# Patient Record
Sex: Female | Born: 1975 | Race: White | Hispanic: No | Marital: Married | Smoking: Never smoker
Health system: Southern US, Community
[De-identification: ages and names within clinical notes are randomized; demographics above are authoritative.]

## PROBLEM LIST (undated history)

## (undated) DIAGNOSIS — N3010 Interstitial cystitis (chronic) without hematuria: Secondary | ICD-10-CM

## (undated) DIAGNOSIS — K589 Irritable bowel syndrome without diarrhea: Secondary | ICD-10-CM

## (undated) DIAGNOSIS — E282 Polycystic ovarian syndrome: Secondary | ICD-10-CM

## (undated) DIAGNOSIS — T7840XA Allergy, unspecified, initial encounter: Secondary | ICD-10-CM

## (undated) DIAGNOSIS — E559 Vitamin D deficiency, unspecified: Secondary | ICD-10-CM

## (undated) DIAGNOSIS — G4700 Insomnia, unspecified: Secondary | ICD-10-CM

## (undated) DIAGNOSIS — R519 Headache, unspecified: Secondary | ICD-10-CM

## (undated) DIAGNOSIS — F319 Bipolar disorder, unspecified: Secondary | ICD-10-CM

## (undated) DIAGNOSIS — T4145XA Adverse effect of unspecified anesthetic, initial encounter: Secondary | ICD-10-CM

## (undated) DIAGNOSIS — F329 Major depressive disorder, single episode, unspecified: Secondary | ICD-10-CM

## (undated) DIAGNOSIS — R51 Headache: Secondary | ICD-10-CM

## (undated) DIAGNOSIS — J189 Pneumonia, unspecified organism: Secondary | ICD-10-CM

## (undated) DIAGNOSIS — F32A Depression, unspecified: Secondary | ICD-10-CM

## (undated) DIAGNOSIS — T8859XA Other complications of anesthesia, initial encounter: Secondary | ICD-10-CM

## (undated) DIAGNOSIS — K219 Gastro-esophageal reflux disease without esophagitis: Secondary | ICD-10-CM

## (undated) DIAGNOSIS — J45909 Unspecified asthma, uncomplicated: Secondary | ICD-10-CM

## (undated) HISTORY — DX: Irritable bowel syndrome, unspecified: K58.9

## (undated) HISTORY — DX: Major depressive disorder, single episode, unspecified: F32.9

## (undated) HISTORY — DX: Unspecified asthma, uncomplicated: J45.909

## (undated) HISTORY — DX: Allergy, unspecified, initial encounter: T78.40XA

## (undated) HISTORY — DX: Depression, unspecified: F32.A

## (undated) HISTORY — DX: Vitamin D deficiency, unspecified: E55.9

## (undated) HISTORY — PX: SALPINGECTOMY: SHX328

## (undated) HISTORY — PX: OTHER SURGICAL HISTORY: SHX169

## (undated) HISTORY — DX: Polycystic ovarian syndrome: E28.2

## (undated) HISTORY — PX: NASAL TURBINATE REDUCTION: SHX2072

## (undated) HISTORY — PX: EYE SURGERY: SHX253

---
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
## 2010-08-20 ENCOUNTER — Emergency Department (HOSPITAL_COMMUNITY): Admission: EM | Admit: 2010-08-20 | Discharge: 2010-08-20 | Payer: Self-pay | Admitting: Emergency Medicine

## 2012-12-30 ENCOUNTER — Ambulatory Visit (INDEPENDENT_AMBULATORY_CARE_PROVIDER_SITE_OTHER): Payer: BC Managed Care – PPO | Admitting: Physician Assistant

## 2012-12-30 VITALS — BP 125/85 | HR 95 | Temp 97.8°F | Resp 18 | Ht 64.25 in | Wt 195.2 lb

## 2012-12-30 DIAGNOSIS — J45909 Unspecified asthma, uncomplicated: Secondary | ICD-10-CM

## 2012-12-30 DIAGNOSIS — J329 Chronic sinusitis, unspecified: Secondary | ICD-10-CM

## 2012-12-30 MED ORDER — ALBUTEROL SULFATE HFA 108 (90 BASE) MCG/ACT IN AERS: 2.0000 | INHALATION_SPRAY | Freq: Four times a day (QID) | RESPIRATORY_TRACT | Status: DC | PRN

## 2012-12-30 MED ORDER — AMOXICILLIN-POT CLAVULANATE 875-125 MG PO TABS: 1.0000 | ORAL_TABLET | Freq: Two times a day (BID) | ORAL | Status: DC

## 2012-12-30 MED ORDER — IPRATROPIUM BROMIDE 0.03 % NA SOLN: 2.0000 | Freq: Two times a day (BID) | NASAL | Status: DC

## 2012-12-30 MED ORDER — PREDNISONE 20 MG PO TABS: ORAL_TABLET | ORAL | Status: DC

## 2012-12-30 NOTE — Progress Notes (Signed)
  Subjective:    Patient ID: Kristi Davies, female    DOB: Jul 18, 1976, 37 y.o.   MRN: [id number]  HPI   Kristi Davies is a 37 yr old female here with concern for sinusitis.  States she had a cold at the end of November but got over it.  Then towards the end of December/beginning of January, began having URI symptoms again.  Feels like she has fluid in her ears.  Hearing somewhat decreased, R>L.  Sinus pressure, facial pain, tooth pain.  Nasal drainage is clear/yellow.  Endorses lots of PND.  Additionally she has a non-productive cough.  Had an asthma attack last night that calmed down with albuterol.  Has a history of asthma, with very few symptoms, usually exercise induced.  Has used Benadryl and Sudafed occasionally, but does not like the way Sudafed makes her feel.  Symptoms have been present for nearly 3 weeks.    Review of Systems  Constitutional: Negative for fever and chills.  HENT: Positive for ear pain, congestion, rhinorrhea and sinus pressure. Negative for sore throat.   Respiratory: Positive for cough, shortness of breath (with asthma attack) and wheezing (with asthma attack).   Cardiovascular: Negative.   Gastrointestinal: Negative.   Musculoskeletal: Negative.   Skin: Negative.   Neurological: Positive for headaches.       Objective:   Physical Exam  Vitals reviewed. Constitutional: She is oriented to person, place, and time. She appears well-developed and well-nourished. No distress.  HENT:  Head: Normocephalic and atraumatic.  Right Ear: Tympanic membrane and ear canal normal.  Left Ear: Ear canal normal. A middle ear effusion is present.  Nose: Right sinus exhibits maxillary sinus tenderness. Right sinus exhibits no frontal sinus tenderness. Left sinus exhibits maxillary sinus tenderness. Left sinus exhibits no frontal sinus tenderness.  Mouth/Throat: Uvula is midline, oropharynx is clear and moist and mucous membranes are normal.  Eyes: Conjunctivae normal are normal.  No scleral icterus.  Neck: Neck supple.  Cardiovascular: Normal rate, regular rhythm, normal heart sounds and intact distal pulses.  Exam reveals no gallop and no friction rub.   No murmur heard. Pulmonary/Chest: Effort normal and breath sounds normal. She has no decreased breath sounds (cough with deep insp). She has no wheezes. She has no rales.  Lymphadenopathy:    She has cervical adenopathy (left).  Neurological: She is alert and oriented to person, place, and time.  Skin: Skin is warm and dry.  Psychiatric: She has a normal mood and affect. Her behavior is normal.      Filed Vitals:   12/30/12 0913  BP: 125/85  Pulse: 95  Temp: 97.8 F (36.6 C)  Resp: 18       Assessment & Plan:   1. Sinusitis  ipratropium (ATROVENT) 0.03 % nasal spray, amoxicillin-clavulanate (AUGMENTIN) 875-125 MG per tablet  2. Asthma  predniSONE (DELTASONE) 20 MG tablet, albuterol (PROVENTIL HFA;VENTOLIN HFA) 108 (90 BASE) MCG/ACT inhaler    Kristi Davies is a very pleasant 37 yr old female with sinusitis.  Will treat with amox/clav and atrovent.  Additionally will try prednisone taper for asthma/cough.  Encouraged scheduled use of albuterol for the next 1-2 days, then prn.  Encouraged fluids and rest.  OTC antihistamines may offer some relief as well.  Discussed RTC precautions.  Pt understands and is in agreement with this plan.

## 2012-12-30 NOTE — Patient Instructions (Addendum)
Begin taking the antibiotic as directed.  Be sure to finish the full course.  Take with food to reduce stomach upset.  The Atrovent nasal spray can be used twice daily for relief of congestion and post-nasal drainage.  This should also help with the pressure in your ears.  An over the counter allergy medicine may also be helpful, like Claritin/Allegra/Zyrtec.  Start the prednisone taper as directed.  I am hopeful that this will calm down the cough.  Use albuterol inhaler scheduled every 4-6 hours for the next day or so, then as needed.  Plenty of fluids and rest.  Let us know if things are worsening or not improving.   Sinusitis Sinusitis is redness, soreness, and swelling (inflammation) of the paranasal sinuses. Paranasal sinuses are air pockets within the bones of your face (beneath the eyes, the middle of the forehead, or above the eyes). In healthy paranasal sinuses, mucus is able to drain out, and air is able to circulate through them by way of your nose. However, when your paranasal sinuses are inflamed, mucus and air can become trapped. This can allow bacteria and other germs to grow and cause infection. Sinusitis can develop quickly and last only a short time (acute) or continue over a long period (chronic). Sinusitis that lasts for more than 12 weeks is considered chronic.  CAUSES  Causes of sinusitis include:  Allergies.  Structural abnormalities, such as displacement of the cartilage that separates your nostrils (deviated septum), which can decrease the air flow through your nose and sinuses and affect sinus drainage.  Functional abnormalities, such as when the small hairs (cilia) that line your sinuses and help remove mucus do not work properly or are not present. SYMPTOMS  Symptoms of acute and chronic sinusitis are the same. The primary symptoms are pain and pressure around the affected sinuses. Other symptoms include:  Upper toothache.  Earache.  Headache.  Bad  breath.  Decreased sense of smell and taste.  A cough, which worsens when you are lying flat.  Fatigue.  Fever.  Thick drainage from your nose, which often is green and may contain pus (purulent).  Swelling and warmth over the affected sinuses. DIAGNOSIS  Your caregiver will perform a physical exam. During the exam, your caregiver may:  Look in your nose for signs of abnormal growths in your nostrils (nasal polyps).  Tap over the affected sinus to check for signs of infection.  View the inside of your sinuses (endoscopy) with a special imaging device with a light attached (endoscope), which is inserted into your sinuses. If your caregiver suspects that you have chronic sinusitis, one or more of the following tests may be recommended:  Allergy tests.  Nasal culture A sample of mucus is taken from your nose and sent to a lab and screened for bacteria.  Nasal cytology A sample of mucus is taken from your nose and examined by your caregiver to determine if your sinusitis is related to an allergy. TREATMENT  Most cases of acute sinusitis are related to a viral infection and will resolve on their own within 10 days. Sometimes medicines are prescribed to help relieve symptoms (pain medicine, decongestants, nasal steroid sprays, or saline sprays).  However, for sinusitis related to a bacterial infection, your caregiver will prescribe antibiotic medicines. These are medicines that will help kill the bacteria causing the infection.  Rarely, sinusitis is caused by a fungal infection. In theses cases, your caregiver will prescribe antifungal medicine. For some cases of chronic sinusitis,  surgery is needed. Generally, these are cases in which sinusitis recurs more than 3 times per year, despite other treatments. HOME CARE INSTRUCTIONS   Drink plenty of water. Water helps thin the mucus so your sinuses can drain more easily.  Use a humidifier.  Inhale steam 3 to 4 times a day (for example,  sit in the bathroom with the shower running).  Apply a warm, moist washcloth to your face 3 to 4 times a day, or as directed by your caregiver.  Use saline nasal sprays to help moisten and clean your sinuses.  Take over-the-counter or prescription medicines for pain, discomfort, or fever only as directed by your caregiver. SEEK IMMEDIATE MEDICAL CARE IF:  You have increasing pain or severe headaches.  You have nausea, vomiting, or drowsiness.  You have swelling around your face.  You have vision problems.  You have a stiff neck.  You have difficulty breathing. MAKE SURE YOU:   Understand these instructions.  Will watch your condition.  Will get help right away if you are not doing well or get worse. Document Released: 11/23/2005 Document Revised: 02/15/2012 Document Reviewed: 12/08/2011 [facility] Patient Information 2013 [place], [place].

## 2013-04-20 ENCOUNTER — Ambulatory Visit
Admission: RE | Admit: 2013-04-20 | Discharge: 2013-04-20 | Disposition: A | Discharge status: Home / Self Care | Payer: BC Managed Care – PPO | Source: Ambulatory Visit | Attending: Family Medicine | Admitting: Family Medicine

## 2013-04-20 ENCOUNTER — Other Ambulatory Visit: Payer: Self-pay | Admitting: Family Medicine

## 2013-04-20 DIAGNOSIS — R7989 Other specified abnormal findings of blood chemistry: Secondary | ICD-10-CM

## 2013-08-25 ENCOUNTER — Encounter (HOSPITAL_COMMUNITY): Payer: Self-pay | Admitting: Pharmacy Technician

## 2013-08-29 NOTE — H&P (Signed)
Kristi Davies is an 37 y.o. female, G2 P1011. She has been evaluated for menorrhagia, dysmenorrhea and SUI.  Urodynamics have confirmed SUI, options have been discussed, and she wishes to proceed with sling.  Saline infusion ultrasound was normal, pipelle benign, she wishes to proceed with Novasure to help with her menses.    Pertinent Gynecological History: Last pap: normal Date: Jan 2014 OB History: G2, P1011 SVD at term, Eab x 1  Menstrual History: No LMP recorded.    Past Medical History  Diagnosis Date  . Allergy   . Asthma   . Depression     No past surgical history on file.  Family History  Problem Relation Age of Onset  . Depression Mother   . Diabetes Mother   . Diabetes Father   . Heart disease Father   . Dementia Maternal Grandmother   . Heart disease Maternal Grandfather   . Dementia Paternal Grandmother   . Arthritis Paternal Grandfather     Social History:  reports that she has never smoked. She does not have any smokeless tobacco history on file. She reports that she does not drink alcohol or use illicit drugs.  Allergies:  Allergies  Allergen Reactions  . Latex Itching  . Levaquin [Levofloxacin In D5w] Hives    No prescriptions prior to admission    Review of Systems  Respiratory: Negative.   Cardiovascular: Negative.   Gastrointestinal: Negative.   Genitourinary: Negative.     There were no vitals taken for this visit. Physical Exam  Constitutional: She appears well-developed and well-nourished.  Neck: Neck supple. No thyromegaly present.  Cardiovascular: Normal rate, regular rhythm and normal heart sounds.   No murmur heard. Respiratory: Effort normal and breath sounds normal. No respiratory distress. She has no wheezes.  GI: Soft. She exhibits no distension and no mass. There is no tenderness.  Genitourinary: Vagina normal and uterus normal.  No adnexal mass    No results found for this or any previous visit (from the past 24  hour(s)).  No results found.  Assessment/Plan: Menorrhagia, dysmenorrhea and SUI.  All medical and surgical options have been discussed.  Procedure, risks, alternatives, chances of relieving symptoms for Novasure and Solyx have been discussed.  Will admit for hysteroscopy and Novasure and Solyx sling.  Kristi Davies D 08/29/2013, 5:04 PM

## 2013-08-30 ENCOUNTER — Encounter (HOSPITAL_COMMUNITY)
Admission: RE | Disposition: A | Discharge status: Home / Self Care | Payer: Self-pay | Source: Ambulatory Visit | Attending: Obstetrics and Gynecology

## 2013-08-30 ENCOUNTER — Ambulatory Visit (HOSPITAL_COMMUNITY)
Admission: RE | Admit: 2013-08-30 | Discharge: 2013-08-30 | Disposition: A | Discharge status: Home / Self Care | Payer: BC Managed Care – PPO | Source: Ambulatory Visit | Attending: Obstetrics and Gynecology | Admitting: Obstetrics and Gynecology

## 2013-08-30 ENCOUNTER — Encounter (HOSPITAL_COMMUNITY): Payer: Self-pay | Admitting: *Deleted

## 2013-08-30 ENCOUNTER — Ambulatory Visit (HOSPITAL_COMMUNITY): Payer: BC Managed Care – PPO | Admitting: Anesthesiology

## 2013-08-30 ENCOUNTER — Encounter (HOSPITAL_COMMUNITY): Payer: Self-pay | Admitting: Anesthesiology

## 2013-08-30 DIAGNOSIS — N393 Stress incontinence (female) (male): Secondary | ICD-10-CM

## 2013-08-30 DIAGNOSIS — N920 Excessive and frequent menstruation with regular cycle: Secondary | ICD-10-CM | POA: Insufficient documentation

## 2013-08-30 DIAGNOSIS — N946 Dysmenorrhea, unspecified: Secondary | ICD-10-CM | POA: Insufficient documentation

## 2013-08-30 HISTORY — PX: HYSTEROSCOPY WITH NOVASURE: SHX5574

## 2013-08-30 HISTORY — PX: BLADDER SUSPENSION: SHX72

## 2013-08-30 LAB — COMPREHENSIVE METABOLIC PANEL
Albumin: 3.2 g/dL — ABNORMAL LOW (ref 3.5–5.2)
BUN: 24 mg/dL — ABNORMAL HIGH (ref 6–23)
Calcium: 8.7 mg/dL (ref 8.4–10.5)
Chloride: 101 mEq/L (ref 96–112)
Creatinine, Ser: 0.69 mg/dL (ref 0.50–1.10)
Total Bilirubin: 0.3 mg/dL (ref 0.3–1.2)
Total Protein: 7 g/dL (ref 6.0–8.3)

## 2013-08-30 LAB — CBC
HCT: 34.2 % — ABNORMAL LOW (ref 36.0–46.0)
MCH: 28.6 pg (ref 26.0–34.0)
MCHC: 33.3 g/dL (ref 30.0–36.0)
MCV: 85.9 fL (ref 78.0–100.0)
RDW: 13.2 % (ref 11.5–15.5)

## 2013-08-30 LAB — PREGNANCY, URINE: Preg Test, Ur: NEGATIVE

## 2013-08-30 SURGERY — HYSTEROSCOPY WITH NOVASURE
Anesthesia: General | Site: Vagina | Wound class: Clean Contaminated

## 2013-08-30 MED ORDER — PROPOFOL 10 MG/ML IV EMUL
INTRAVENOUS | Status: AC
  Filled 2013-08-30: qty 20

## 2013-08-30 MED ORDER — LIDOCAINE HCL (CARDIAC) 20 MG/ML IV SOLN
INTRAVENOUS | Status: AC
  Filled 2013-08-30: qty 5

## 2013-08-30 MED ORDER — CEFAZOLIN SODIUM-DEXTROSE 2-3 GM-% IV SOLR
INTRAVENOUS | Status: AC
  Filled 2013-08-30: qty 50

## 2013-08-30 MED ORDER — KETOROLAC TROMETHAMINE 30 MG/ML IJ SOLN
INTRAMUSCULAR | Status: AC
  Filled 2013-08-30: qty 1

## 2013-08-30 MED ORDER — LIDOCAINE HCL 2 % IJ SOLN
INTRAMUSCULAR | Status: AC
  Filled 2013-08-30: qty 20

## 2013-08-30 MED ORDER — DEXAMETHASONE SODIUM PHOSPHATE 10 MG/ML IJ SOLN
INTRAMUSCULAR | Status: AC
  Filled 2013-08-30: qty 1

## 2013-08-30 MED ORDER — STERILE WATER FOR IRRIGATION IR SOLN
Status: DC | PRN
  Administered 2013-08-30: 1000 mL via INTRAVESICAL

## 2013-08-30 MED ORDER — CEFAZOLIN SODIUM-DEXTROSE 2-3 GM-% IV SOLR
2.0000 g | INTRAVENOUS | Status: AC
  Administered 2013-08-30: 2 g via INTRAVENOUS

## 2013-08-30 MED ORDER — SULFAMETHOXAZOLE-TMP DS 800-160 MG PO TABS: 1.0000 | ORAL_TABLET | Freq: Two times a day (BID) | ORAL | Status: DC

## 2013-08-30 MED ORDER — FENTANYL CITRATE 0.05 MG/ML IJ SOLN
INTRAMUSCULAR | Status: AC
  Filled 2013-08-30: qty 5

## 2013-08-30 MED ORDER — FENTANYL CITRATE 0.05 MG/ML IJ SOLN
25.0000 ug | INTRAMUSCULAR | Status: DC | PRN
  Administered 2013-08-30 (×2): 50 ug via INTRAVENOUS

## 2013-08-30 MED ORDER — LACTATED RINGERS IV SOLN: INTRAVENOUS | Status: DC

## 2013-08-30 MED ORDER — LACTATED RINGERS IV SOLN
INTRAVENOUS | Status: DC
  Administered 2013-08-30 (×2): via INTRAVENOUS

## 2013-08-30 MED ORDER — BUPIVACAINE-EPINEPHRINE (PF) 0.5% -1:200000 IJ SOLN
INTRAMUSCULAR | Status: AC
  Filled 2013-08-30: qty 10

## 2013-08-30 MED ORDER — LIDOCAINE HCL 1 % IJ SOLN
INTRAMUSCULAR | Status: DC | PRN
  Administered 2013-08-30: 5 mL

## 2013-08-30 MED ORDER — FENTANYL CITRATE 0.05 MG/ML IJ SOLN
INTRAMUSCULAR | Status: DC | PRN
  Administered 2013-08-30 (×3): 50 ug via INTRAVENOUS

## 2013-08-30 MED ORDER — KETOROLAC TROMETHAMINE 30 MG/ML IJ SOLN
INTRAMUSCULAR | Status: DC | PRN
  Administered 2013-08-30: 30 mg via INTRAVENOUS

## 2013-08-30 MED ORDER — ONDANSETRON HCL 4 MG/2ML IJ SOLN
INTRAMUSCULAR | Status: DC | PRN
  Administered 2013-08-30: 4 mg via INTRAVENOUS

## 2013-08-30 MED ORDER — LACTATED RINGERS IR SOLN
Status: DC | PRN
  Administered 2013-08-30: 3000 mL

## 2013-08-30 MED ORDER — PROPOFOL 10 MG/ML IV BOLUS
INTRAVENOUS | Status: DC | PRN
  Administered 2013-08-30: 200 mg via INTRAVENOUS

## 2013-08-30 MED ORDER — FENTANYL CITRATE 0.05 MG/ML IJ SOLN
INTRAMUSCULAR | Status: AC
  Administered 2013-08-30: 50 ug via INTRAVENOUS
  Filled 2013-08-30: qty 2

## 2013-08-30 MED ORDER — LIDOCAINE HCL (CARDIAC) 20 MG/ML IV SOLN
INTRAVENOUS | Status: DC | PRN
  Administered 2013-08-30: 50 mg via INTRAVENOUS

## 2013-08-30 MED ORDER — ONDANSETRON HCL 4 MG/2ML IJ SOLN
INTRAMUSCULAR | Status: AC
Start: 2013-08-30 — End: 2013-08-30
  Filled 2013-08-30: qty 2

## 2013-08-30 MED ORDER — DEXAMETHASONE SODIUM PHOSPHATE 10 MG/ML IJ SOLN
INTRAMUSCULAR | Status: DC | PRN
  Administered 2013-08-30: 10 mg via INTRAVENOUS

## 2013-08-30 MED ORDER — MIDAZOLAM HCL 5 MG/5ML IJ SOLN
INTRAMUSCULAR | Status: DC | PRN
  Administered 2013-08-30: 2 mg via INTRAVENOUS

## 2013-08-30 MED ORDER — LIDOCAINE HCL 2 % IJ SOLN
INTRAMUSCULAR | Status: DC | PRN
  Administered 2013-08-30: 16 mL

## 2013-08-30 MED ORDER — ESTRADIOL 0.1 MG/GM VA CREA
TOPICAL_CREAM | VAGINAL | Status: AC
  Filled 2013-08-30: qty 42.5

## 2013-08-30 MED ORDER — BUPIVACAINE-EPINEPHRINE 0.5% -1:200000 IJ SOLN
INTRAMUSCULAR | Status: DC | PRN
  Administered 2013-08-30: 5 mL

## 2013-08-30 MED ORDER — HYDROCODONE-ACETAMINOPHEN 5-325 MG PO TABS: 1.0000 | ORAL_TABLET | ORAL | Status: DC | PRN

## 2013-08-30 MED ORDER — MIDAZOLAM HCL 2 MG/2ML IJ SOLN
INTRAMUSCULAR | Status: AC
  Filled 2013-08-30: qty 2

## 2013-08-30 SURGICAL SUPPLY — 32 items
ABLATOR ENDOMETRIAL BIPOLAR (ABLATOR) ×2 IMPLANT
BLADE SURG 15 STRL LF C SS BP (BLADE) ×1 IMPLANT
BLADE SURG 15 STRL SS (BLADE) ×1
CANISTER SUCTION 2500CC (MISCELLANEOUS) ×2 IMPLANT
CATH ROBINSON RED A/P 16FR (CATHETERS) ×2 IMPLANT
CATH SILICONE 16FRX5CC (CATHETERS) ×2 IMPLANT
CLOTH BEACON ORANGE TIMEOUT ST (SAFETY) ×2 IMPLANT
DECANTER SPIKE VIAL GLASS SM (MISCELLANEOUS) IMPLANT
DERMABOND ADVANCED (GAUZE/BANDAGES/DRESSINGS)
DERMABOND ADVANCED .7 DNX12 (GAUZE/BANDAGES/DRESSINGS) IMPLANT
DRAPE HYSTEROSCOPY (DRAPE) ×2 IMPLANT
DRESSING TELFA 8X3 (GAUZE/BANDAGES/DRESSINGS) ×2 IMPLANT
GAUZE PACKING 2X5 YD STERILE (GAUZE/BANDAGES/DRESSINGS) IMPLANT
GLOVE BIO SURGEON STRL SZ8 (GLOVE) ×2 IMPLANT
GLOVE ORTHO TXT STRL SZ7.5 (GLOVE) ×2 IMPLANT
GOWN PREVENTION PLUS LG XLONG (DISPOSABLE) ×4 IMPLANT
GOWN STRL REIN XL XLG (GOWN DISPOSABLE) ×4 IMPLANT
NEEDLE HYPO 22GX1.5 SAFETY (NEEDLE) ×2 IMPLANT
NEEDLE SPNL 22GX3.5 QUINCKE BK (NEEDLE) ×2 IMPLANT
NS IRRIG 1000ML POUR BTL (IV SOLUTION) ×2 IMPLANT
PACK HYSTEROSCOPY LF (CUSTOM PROCEDURE TRAY) ×2 IMPLANT
PACK VAGINAL WOMENS (CUSTOM PROCEDURE TRAY) ×2 IMPLANT
PAD OB MATERNITY 4.3X12.25 (PERSONAL CARE ITEMS) ×2 IMPLANT
SET CYSTO W/LG BORE CLAMP LF (SET/KITS/TRAYS/PACK) ×2 IMPLANT
SLING SOLYX SYSTEM SIS BX (SLING) IMPLANT
SLING SOLYX SYSTEM SIS EA (Sling) ×2 IMPLANT
SUT VIC AB 2-0 CT1 (SUTURE) IMPLANT
SUT VIC AB 2-0 CT2 27 (SUTURE) ×2 IMPLANT
SYR CONTROL 10ML LL (SYRINGE) ×2 IMPLANT
TOWEL OR 17X24 6PK STRL BLUE (TOWEL DISPOSABLE) ×4 IMPLANT
TRAY FOLEY CATH 14FR (SET/KITS/TRAYS/PACK) ×2 IMPLANT
WATER STERILE IRR 1000ML POUR (IV SOLUTION) ×2 IMPLANT

## 2013-08-30 NOTE — Interval H&P Note (Signed)
History and Physical Interval Note:  08/30/2013 7:11 AM  Kristi Davies  has presented today for surgery, with the diagnosis of menorrhagia, dysmenorrhea, SUI  The various methods of treatment have been discussed with the patient and family. After consideration of risks, benefits and other options for treatment, the patient has consented to  Procedure(s) with comments: HYSTEROSCOPY WITH NOVASURE (N/A) - 1 1/2 hrs OR time TRANSVAGINAL TAPE (TVT) PROCEDURE (N/A) as a surgical intervention .  The patient's history has been reviewed, patient examined, no change in status, stable for surgery.  I have reviewed the patient's chart and labs.  Questions were answered to the patient's satisfaction.     [name] D

## 2013-08-30 NOTE — Preoperative (Signed)
Beta Blockers   Reason not to administer Beta Blockers:Not Applicable 

## 2013-08-30 NOTE — Anesthesia Preprocedure Evaluation (Signed)
Anesthesia Evaluation  Patient identified by MRN, date of birth, ID band Patient awake    Reviewed: Allergy & Precautions, H&P , Patient's Chart, lab work & pertinent test results, reviewed documented beta blocker date and time   Airway Mallampati: II TM Distance: >3 FB Neck ROM: full    Dental no notable dental hx.    Pulmonary asthma (last inhaler 2days ago. Use pre-op x2. Chest clear) ,  breath sounds clear to auscultation  Pulmonary exam normal       Cardiovascular Rhythm:regular Rate:Normal     Neuro/Psych    GI/Hepatic   Endo/Other    Renal/GU      Musculoskeletal   Abdominal   Peds  Hematology   Anesthesia Other Findings   Reproductive/Obstetrics                           Anesthesia Physical Anesthesia Plan  ASA: II  Anesthesia Plan:    Post-op Pain Management:    Induction: Intravenous  Airway Management Planned: LMA  Additional Equipment:   Intra-op Plan:   Post-operative Plan:   Informed Consent: I have reviewed the patients History and Physical, chart, labs and discussed the procedure including the risks, benefits and alternatives for the proposed anesthesia with the patient or authorized representative who has indicated his/her understanding and acceptance.   Dental Advisory Given and Dental advisory given  Plan Discussed with: CRNA and Surgeon  Anesthesia Plan Comments:         Anesthesia Quick Evaluation

## 2013-08-30 NOTE — Transfer of Care (Signed)
Immediate Anesthesia Transfer of Care Note  Patient: Kristi Davies  Procedure(s) Performed: Procedure(s) with comments: HYSTEROSCOPY WITH NOVASURE (N/A) - 1 1/2 hrs OR time TRANSVAGINAL TAPE (TVT) PROCEDURE (N/A)  Patient Location: PACU  Anesthesia Type:General  Level of Consciousness: awake, alert  and oriented  Airway & Oxygen Therapy: Patient Spontanous Breathing and Patient connected to nasal cannula oxygen  Post-op Assessment: Report given to PACU RN  Post vital signs: Reviewed  Complications: No apparent anesthesia complications

## 2013-08-30 NOTE — Op Note (Addendum)
Preoperative diagnosis: Menorrhagia, dysmenorrhea, SUI Postoperative diagnosis: Same Procedure: Hysteroscopy, adhesiolysis, NovaSure endometrial ablation, Solyx sling Surgeon: Lavina Hamman M.D. Anesthesia: Gen. With an LMA, deep paracervical block Findings: She had a normal endometrial cavity except for a small vertical adhesion at the fundus. The NovaSure device used to a depth of 6 cm, a width of 4 cm and used 130W for 78 seconds. Fluid deficit to the hysteroscope was110 cc. Estimated blood loss: 100cc Specimens: None Complications: None  Procedure in detail: The patient was taken to the operating room and placed in the dorsosupine position. General anesthesia was induced and she was placed in mobile stirrups. Perineum and vagina were prepped and draped in usual sterile fashion and bladder drained with a latex free catheter. A Graves speculum was inserted into the vagina and the anterior lip of the cervix was grasped with a single-tooth tenaculum. The paracervical block was then performed with a total of 16 cc 2% lidocaine. Uterus then sounded to 9 cm. Cervix was easily dilated to size 23 dilator. The observer hysteroscope was inserted and good visualization was achieved using lactated Ringer's. The endometrial cavity was normal except for the vertical adhesion.  Small scissors were introduced through the hysteroscope and this adhesion was lysed. The hysteroscope was removed. The cervix was further dilated to a size 7 and size 8 Hegar dilator measuring the cervix a 3 cm. The NovaSure device was inserted and deployed properly. The CO2 test passed. Endometrial ablation was performed with the above-mentioned settings without difficulty. The device was then allowed to cool for about 30 seconds and was removed. Hysteroscopy was then performed which revealed good global endometrial ablation and still no lesions. Hysteroscope and fluid were then removed. The single-tooth tenaculum was removed from the  cervix. Bleeding was controlled with pressure.    The anterior vagina was grasped with Allis clamps proximally 1 1/2 fingerbreadths from the urethral meatus. Local anesthetic with half percent Marcaine with epi and 1% lidocaine was infiltrated in the midline and bilaterally for hydrodissection. A 1 cm vertical incision was was then made in the vagina between the Allis clamps. The edges of the incision were then grasped with the Allis clamps. Metzenbaum scissors were used to sharply dissected the vaginal mucosa to each pubic ramus. The Solyx sling was then first placed on the patient's right side and anchored behind the pubic bone. A good placement was achieved on the right side. Placement was achieved on the left side in a similar fashion submucosal to just past the pubic ramus. A right angle clamp was able to just barely be passed between the sling and the urethral tissue confirming good tension. Cystoscopy was performed which revealed a normal bladder and no evidence of injury to the bladder or the urethra. 200 cc of fluid was used for the cystoscopy. The cystoscope was removed. A Cred maneuver was performed and no leakage was seen. The Solyx sling was released on the left side. The vaginal incision was then closed with running locking 2-0 Vicryl with adequate closure and adequate hemostasis. All instruments were then removed from the vagina. The patient tolerated the procedure well and was taken to the recovery room in stable condition. Counts were correct, she received Ancef 2 gm IV prior to the procedure, she had PAS hose on throughout the procedure.

## 2013-08-30 NOTE — Anesthesia Postprocedure Evaluation (Signed)
Anesthesia Post Note  Patient: Kristi Davies  Procedure(s) Performed: Procedure(s) (LRB): HYSTEROSCOPY WITH NOVASURE (N/A) TRANSVAGINAL TAPE (TVT) PROCEDURE (N/A)  Anesthesia type: General  Patient location: PACU  Post pain: Pain level controlled  Post assessment: Post-op Vital signs reviewed  Last Vitals:  Filed Vitals:   08/30/13 0830  BP: 127/81  Pulse:   Temp:   Resp: 16    Post vital signs: Reviewed  Level of consciousness: sedated  Complications: No apparent anesthesia complications

## 2013-08-31 ENCOUNTER — Encounter (HOSPITAL_COMMUNITY): Payer: Self-pay | Admitting: Obstetrics and Gynecology

## 2013-10-12 ENCOUNTER — Other Ambulatory Visit: Payer: Self-pay

## 2013-10-18 ENCOUNTER — Ambulatory Visit (INDEPENDENT_AMBULATORY_CARE_PROVIDER_SITE_OTHER): Payer: BC Managed Care – PPO | Admitting: Family Medicine

## 2013-10-18 VITALS — BP 112/66 | HR 121 | Temp 99.0°F | Resp 18 | Ht 64.5 in | Wt 184.0 lb

## 2013-10-18 DIAGNOSIS — R059 Cough, unspecified: Secondary | ICD-10-CM

## 2013-10-18 DIAGNOSIS — J069 Acute upper respiratory infection, unspecified: Secondary | ICD-10-CM

## 2013-10-18 DIAGNOSIS — J040 Acute laryngitis: Secondary | ICD-10-CM

## 2013-10-18 DIAGNOSIS — R05 Cough: Secondary | ICD-10-CM

## 2013-10-18 MED ORDER — AMOXICILLIN 875 MG PO TABS: 875.0000 mg | ORAL_TABLET | Freq: Two times a day (BID) | ORAL | Status: DC

## 2013-10-18 MED ORDER — BENZONATATE 100 MG PO CAPS: 100.0000 mg | ORAL_CAPSULE | Freq: Three times a day (TID) | ORAL | Status: DC | PRN

## 2013-10-18 NOTE — Progress Notes (Signed)
Subjective: 37 year old lady who presents with a one-week history of a respiratory tract infection. 2 of her children had a similar problem a week ago. Although she did good handwashing, she caught it also. Her symptoms started a week ago. She had a cough and a little sore throat. She's developed hoarseness. She continues to cough. She says her temperature usually runs a little on the low side, and a tiny bit high today. She's not been having any chills. She's not coughing up a lot of stuff. She does not smoke. She does get a fair number of upper respiratory infections. She has not had a flu shot this year.  Objective: TMs are normal. Throat is not in her erythematous. Voice is a little weak. Chest is clear to auscultation. Neck supple without significant nodes.  Assessment: URI with prolonged symptoms  Plan:

## 2013-10-18 NOTE — Patient Instructions (Signed)
Drink plenty of fluids and get enough rest  Take amoxicillin one twice daily  Use the Tessalon (benzatonate) cough pills one every 6-8 hours as needed for cough

## 2013-11-07 ENCOUNTER — Ambulatory Visit (INDEPENDENT_AMBULATORY_CARE_PROVIDER_SITE_OTHER): Payer: BC Managed Care – PPO | Admitting: Family Medicine

## 2013-11-07 ENCOUNTER — Ambulatory Visit: Payer: BC Managed Care – PPO

## 2013-11-07 DIAGNOSIS — R079 Chest pain, unspecified: Secondary | ICD-10-CM

## 2013-11-07 MED ORDER — PREDNISONE 20 MG PO TABS: 40.0000 mg | ORAL_TABLET | Freq: Every day | ORAL | Status: DC

## 2013-11-07 MED ORDER — HYDROCODONE-ACETAMINOPHEN 5-325 MG PO TABS: 1.0000 | ORAL_TABLET | ORAL | Status: DC | PRN
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
## 2015-10-17 LAB — BASIC METABOLIC PANEL: Glucose: 114 mg/dL

## 2015-10-17 LAB — HEMOGLOBIN A1C: HEMOGLOBIN A1C: 5.6 % (ref 4.0–6.0)

## 2015-10-17 LAB — TSH: TSH: 3.31 u[IU]/mL (ref 0.41–5.90)

## 2015-11-11 ENCOUNTER — Encounter: Payer: Self-pay | Admitting: Internal Medicine

## 2015-11-12 ENCOUNTER — Encounter: Payer: Self-pay | Admitting: Internal Medicine

## 2015-11-12 ENCOUNTER — Ambulatory Visit (INDEPENDENT_AMBULATORY_CARE_PROVIDER_SITE_OTHER): Payer: BLUE CROSS/BLUE SHIELD | Admitting: Internal Medicine

## 2015-11-12 VITALS — BP 114/62 | HR 114 | Temp 98.1°F | Resp 12 | Ht 64.0 in | Wt 194.0 lb

## 2015-11-12 DIAGNOSIS — E282 Polycystic ovarian syndrome: Secondary | ICD-10-CM

## 2015-11-12 MED ORDER — SPIRONOLACTONE 50 MG PO TABS: 50.0000 mg | ORAL_TABLET | Freq: Every day | ORAL | Status: DC

## 2015-11-12 NOTE — Progress Notes (Signed)
Patient ID: Kristi Davies, female   DOB: May 18, 1976, 39 y.o.   MRN: [id number]  HPI: Kristi Davies is a 39 y.o. female, referred by Manon HildingJessica Mauney, PA, for management of PCOS.  Pt was dx with PCOS few years ago: hirsutism, acne, weight gain. She was started on OCP then. Hirsutism and acne greatly improved on the OCPs.  She was started on Metformin later, by PCP >> lost 20 lbs in 1 mo.  Weight started to increase again despite not changing diet and exercise. Her dose was increased to Metformin 1000 mg daily 1 mo ago >> lost 8 lbs.   She is on: - Metformin ER 500 mg daily - Yaz since ~2012>> wants to stop soon as she will have B tubal ligation.  - did not try Spironolactone - did not try Vaniqua (sensitive skin)  Weight gain: - over the years - + steroid use - Prednisone in the past >> moody, fluid retention - has to use steroids 1x a year (asthma) - no weight loss meds - sciatica >> not a lot of exercise; now Tai[place] and walking - Drinks: soda 2-3x a mo -  Meals:  Breakfast: cereal or eggs, coffee  Lunch: Sandwich or soup  Dinner: usually salad or meat plus vegetables  Snacks: 1 a day  Fertility/Menstrual cycles: - regular menses initially (but heavy), then stopped after endom. ablation (2014) after she bled for 1 mo - no h/o ovarian cysts - children: 1: 38 y/o - miscarriages: no - contraception: Yaz >>  But would like to stop soon after she has tubal ligation later this month.  Acne: - better: cystic, now only scarring - chin, shoulders  Hirsutism: - sideburns and upper lip  - Last thyroid tests: Lab Results  Component Value Date   TSH 3.31 10/17/2015   - Last HbA1c: Lab Results  Component Value Date   HGBA1C 5.6 10/17/2015   She has FH of DM in both parents.   ROS: Constitutional: + weight gain, + fatigue, no subjective hyperthermia/hypothermia, +  dysuria Eyes: no blurry vision, no xerophthalmia ENT: no sore throat, no nodules palpated in throat, +  dysphagia/no odynophagia, no hoarseness Cardiovascular: no CP/SOB/palpitations/leg swelling Respiratory: + cough/+ SOB Gastrointestinal: no N/V/+ D/+ C Musculoskeletal: no muscle/joint aches Skin: + acne, +  Excessive hair growth, + easy bruising Neurological: no tremors/numbness/tingling/dizziness, + HA Psychiatric: +  Depression/no anxiety  Past Medical History  Diagnosis Date  . Allergy   . Asthma   . Depression   . PCOS (polycystic ovarian syndrome)   . IBS (irritable bowel syndrome)   . Vitamin D deficiency    Past Surgical History  Procedure Laterality Date  . Hysteroscopy with novasure N/A 08/30/2013    Procedure: HYSTEROSCOPY WITH NOVASURE;  Surgeon: Lavina Hammanodd Meisinger, MD;  Location: [facility];  Service: Gynecology;  Laterality: N/A;  1 1/2 hrs OR time  . Bladder suspension N/A 08/30/2013    Procedure: TRANSVAGINAL TAPE (TVT) PROCEDURE;  Surgeon: Lavina Hammanodd Meisinger, MD;  Location: [facility];  Service: Gynecology;  Laterality: N/A;   Social History   Social History  . Marital Status: Single    Spouse Name: N/A  . Number of Children: 1    Social History Main Topics  . Smoking status: Never Smoker   . Smokeless tobacco: Not on file  . Alcohol Use: 0.0 oz/week    0 Standard drinks or equivalent per week  . Drug Use: No  . Sexual Activity: Yes    Birth Control/ Protection: Other-see  comments     Comment: vasectomy   Social History Narrative   Single   Self-employed   1 daughter   Exercise: yes   Caffeine use: yes      Current Outpatient Prescriptions on File Prior to Visit  Medication Sig Dispense Refill  . albuterol (PROVENTIL HFA;VENTOLIN HFA) 108 (90 BASE) MCG/ACT inhaler Inhale 2 puffs into the lungs every 6 (six) hours as needed. 8.5 g 2  . clonazePAM (KLONOPIN) 0.5 MG tablet Take 0.5 mg by mouth 2 (two) times daily.    . cyclobenzaprine (FLEXERIL) 10 MG tablet TAKE 1 TABLET BY MOUTH EVERY 8 HOURS AS NEEDED FOR PAIN.  0  . FLOVENT HFA 44 MCG/ACT inhaler Inhale 2 puffs  into the lungs 2 (two) times daily.  5  . FLUARIX QUADRIVALENT 0.5 ML injection FLU SHOT  0  . lamoTRIgine (LAMICTAL) 150 MG tablet Take 150 mg by mouth daily.    . LORYNA 3-0.02 MG tablet TAKE 1 TABLET(S) EVERY DAY BY ORAL ROUTE FOR 28 DAYS.  12  . metformin (FORTAMET) 1000 MG (OSM) 24 hr tablet TAKE 1 TABLET WITH EVENING MEAL ONCE A DAY ORALLY 90 DAY(S)  1  . montelukast (SINGULAIR) 10 MG tablet Take 10 mg by mouth at bedtime.    Marland Kitchen QUEtiapine (SEROQUEL XR) 200 MG 24 hr tablet Take 200 mg by mouth at bedtime.     No current facility-administered medications on file prior to visit.   Allergies  Allergen Reactions  . Latex Itching  . Levaquin [Levofloxacin In D5w] Hives   Family History  Problem Relation Age of Onset  . Depression Mother   . Diabetes Mother   . Diabetes Father   . Heart disease Father   . Dementia Maternal Grandmother   . Heart disease Maternal Grandfather   . Dementia Paternal Grandmother   . Arthritis Paternal Grandfather    PE: BP 114/62 mmHg  Pulse 114  Temp(Src) 98.1 F (36.7 C) (Oral)  Resp 12  Ht  (1.626 m)  Wt 194 lb (87.998 kg)  BMI 33.28 kg/m2  SpO2 97% Wt Readings from Last 3 Encounters:  11/12/15 194 lb (87.998 kg)  11/07/13 183 lb (83.008 kg)  10/18/13 184 lb (83.462 kg)   Constitutional: overweight, in NAD, no full supraclavicular fat pads Eyes: PERRLA, EOMI, no exophthalmos ENT: moist mucous membranes, no thyromegaly, no cervical lymphadenopathy Cardiovascular:  Tachycardia,RR, No MRG Respiratory: CTA B Gastrointestinal: abdomen soft, NT, ND, BS+ Musculoskeletal: no deformities, strength intact in all 4 Skin: moist, warm; + acne on face,  no dark terminal hair on chin,  no vellum on sideburns, no skin tags, very faint acanthosis nigricans, no purple, wide, stretch marks Neurological: no tremor with outstretched hands, DTR normal in all 4  ASSESSMENT: 1. PCOS  PLAN: 1.  I had a long discussion with the patient about the fact  that the PCOS is a misnomer, a patient does not necessarily have to have polycystic ovaries to be diagnosed with the disorder. This is of sum of several conditions, including:  weight gain  insulin resistance (and therefore a higher risk of developing diabetes later in life)  acne  hirsutism  irregular menstrual cycles  decreased fertility. - We also discussed about the fact that the treatment is usually targeted to addressing the problem that concerns the patient the most: acne/hirsutism, weight gain, or fertility, but there is no single treatment for PCOS.  - The first-line therapy are oral contraceptives. If she is concerned with  her weight, metformin is indicated; if she is concerned about acne/hirsutism, we can add spironolactone; and if she is concerned about fertility, I could refer her to reproductive endocrinology for possible use of clomiphene. -  She is on oral contraceptives right now, which helped a lot with acne and hirsutism, however, she is telling me that she sees some breakthrough acne after addition of Lamictal.  She would like to stop Yaz after her tubes are tied in a week. She is wondering whether spironolactone would be a good idea to add after that to control her acne and hirsutism. We can definitely add this, but I also asked her to consider adding Resveratrol first , as this was recently shown to significantly improve insulin levels and subsequently under general levels in patients with PCOS.  She is interested in this, but would like to start  With spironolactone.  I advised her to to start after the surgery and only after she comes off her OCP. We will start with 25 mg twice a day for 2 days and then increase to 50 mg twice a day for 5 days, after which she will present for a nurse visit for blood pressure check in the lab visit for a potassium check. - We'll continue metformin  ERat the current dose,  Of 1000 mg at night.  She had a spectacular response to metformin, and I  discussed with her that we can increase this even further if needed , to 2000 mg daily.  She had a recent hemoglobin A1c, which is excellent at 5.6%. No signs of prediabetes or diabetes.  She does have a family history of diabetes. -  I will see her back in 6 months

## 2015-11-12 NOTE — Patient Instructions (Signed)
Please start Spironolactone after the surgery and after you stop Yaz: - start with 25 mg 2x a day x 2 days - then increase to 50 mg 2x a day x 5 days The come for labs and a nurse appt to check your Blood Pressure.  Please continue Metformin ER 1000 mg at night.  Please check out the following website: [email or web address]  Please come back for a follow-up appointment in 6 months.

## 2015-11-13 ENCOUNTER — Encounter (HOSPITAL_COMMUNITY): Payer: Self-pay

## 2015-11-18 ENCOUNTER — Encounter (HOSPITAL_COMMUNITY): Payer: Self-pay | Admitting: Anesthesiology

## 2015-11-18 NOTE — Anesthesia Preprocedure Evaluation (Addendum)
Anesthesia Evaluation  Patient identified by MRN, date of birth, ID band Patient awake    History of Anesthesia Complications Negative for: history of anesthetic complications  Airway Mallampati: III  TM Distance: >3 FB Neck ROM: Full    Dental no notable dental hx. (+) Dental Advisory Given   Pulmonary asthma , pneumonia, resolved,    Pulmonary exam normal breath sounds clear to auscultation       Cardiovascular negative cardio ROS Normal cardiovascular exam Rhythm:Regular Rate:Normal     Neuro/Psych  Headaches, PSYCHIATRIC DISORDERS Depression Bipolar Disorder    GI/Hepatic Neg liver ROS, IBS   Endo/Other  Obesity  Renal/GU negative Renal ROS  negative genitourinary   Musculoskeletal negative musculoskeletal ROS (+)   Abdominal   Peds  Hematology negative hematology ROS (+)   Anesthesia Other Findings   Reproductive/Obstetrics Desires sterilization                            Anesthesia Physical Anesthesia Plan  ASA: II  Anesthesia Plan: General   Post-op Pain Management:    Induction: Intravenous  Airway Management Planned: Oral ETT  Additional Equipment:   Intra-op Plan:   Post-operative Plan: Extubation in OR  Informed Consent: I have reviewed the patients History and Physical, chart, labs and discussed the procedure including the risks, benefits and alternatives for the proposed anesthesia with the patient or authorized representative who has indicated his/her understanding and acceptance.   Dental advisory given  Plan Discussed with: CRNA, Surgeon and Anesthesiologist  Anesthesia Plan Comments:         Anesthesia Quick Evaluation

## 2015-11-19 NOTE — H&P (Signed)
Kristi Davies is an 39 y.o. female. She is interested in permanent sterility.  Pertinent Gynecological History: Last pap: normal Date: 12/2012 OB History: G2, P1011   Menstrual History: No LMP recorded. Patient is not currently having periods (Reason: Other).    Past Medical History  Diagnosis Date  . Allergy   . Asthma   . Depression   . PCOS (polycystic ovarian syndrome)   . IBS (irritable bowel syndrome)   . Vitamin D deficiency   . Pneumonia     history of  . Headache     Migraines  . Bipolar disorder (HCC)     Type 2  . Insomnia     Past Surgical History  Procedure Laterality Date  . Hysteroscopy with novasure N/A 08/30/2013    Procedure: HYSTEROSCOPY WITH NOVASURE;  Surgeon: Lavina Hammanodd Kymberlyn Eckford, MD;  Location: [facility];  Service: Gynecology;  Laterality: N/A;  1 1/2 hrs OR time  . Bladder suspension N/A 08/30/2013    Procedure: TRANSVAGINAL TAPE (TVT) PROCEDURE;  Surgeon: Lavina Hammanodd Merrillyn Ackerley, MD;  Location: [facility];  Service: Gynecology;  Laterality: N/A;  . Eye surgery      Family History  Problem Relation Age of Onset  . Depression Mother   . Diabetes Mother   . Diabetes Father   . Heart disease Father   . Dementia Maternal Grandmother   . Heart disease Maternal Grandfather   . Dementia Paternal Grandmother   . Arthritis Paternal Grandfather     Social History:  reports that she has never smoked. She has never used smokeless tobacco. She reports that she drinks alcohol. She reports that she does not use illicit drugs.  Allergies:  Allergies  Allergen Reactions  . Latex Itching  . Levaquin [Levofloxacin In D5w] Hives    No prescriptions prior to admission    Review of Systems  Respiratory: Negative.   Cardiovascular: Negative.   Gastrointestinal: Negative.   Genitourinary: Negative.     There were no vitals taken for this visit. Physical Exam  Constitutional: She appears well-developed and well-nourished.  Neck: Neck supple. No thyromegaly present.   Cardiovascular: Normal rate, regular rhythm and normal heart sounds.   No murmur heard. Respiratory: Effort normal and breath sounds normal. No respiratory distress. She has no wheezes.  GI: Soft. She exhibits no distension and no mass. There is no tenderness.  Genitourinary: Vagina normal and uterus normal.  No adnexal mass    No results found for this or any previous visit (from the past 24 hour(s)).  No results found.  Assessment/Plan: Desires surgical sterility.  Discussed all medical and surgical options, surgical procedure and risks.  Will admit for laparoscopic bilateral salpingectomy.  [place] Espina D 11/19/2015, 8:36 PM

## 2015-11-20 ENCOUNTER — Ambulatory Visit (HOSPITAL_COMMUNITY): Payer: BLUE CROSS/BLUE SHIELD | Admitting: Anesthesiology

## 2015-11-20 ENCOUNTER — Encounter (HOSPITAL_COMMUNITY)
Admission: RE | Disposition: A | Discharge status: Home / Self Care | Payer: Self-pay | Source: Ambulatory Visit | Attending: Obstetrics and Gynecology

## 2015-11-20 ENCOUNTER — Encounter (HOSPITAL_COMMUNITY): Payer: Self-pay

## 2015-11-20 ENCOUNTER — Ambulatory Visit (HOSPITAL_COMMUNITY)
Admission: RE | Admit: 2015-11-20 | Discharge: 2015-11-20 | Disposition: A | Discharge status: Home / Self Care | Payer: BLUE CROSS/BLUE SHIELD | Source: Ambulatory Visit | Attending: Obstetrics and Gynecology | Admitting: Obstetrics and Gynecology

## 2015-11-20 DIAGNOSIS — Z302 Encounter for sterilization: Secondary | ICD-10-CM | POA: Insufficient documentation

## 2015-11-20 HISTORY — DX: Headache, unspecified: R51.9

## 2015-11-20 HISTORY — PX: LAPAROSCOPIC TUBAL LIGATION: SHX1937

## 2015-11-20 HISTORY — DX: Headache: R51

## 2015-11-20 HISTORY — DX: Pneumonia, unspecified organism: J18.9

## 2015-11-20 HISTORY — DX: Insomnia, unspecified: G47.00

## 2015-11-20 HISTORY — DX: Bipolar disorder, unspecified: F31.9

## 2015-11-20 LAB — BASIC METABOLIC PANEL
Anion gap: 10 (ref 5–15)
BUN: 14 mg/dL (ref 6–20)
CALCIUM: 9.1 mg/dL (ref 8.9–10.3)
CO2: 23 mmol/L (ref 22–32)
Chloride: 104 mmol/L (ref 101–111)
Creatinine, Ser: 0.76 mg/dL (ref 0.44–1.00)
GFR calc Af Amer: >60 mL/min (ref >60)
GLUCOSE: 97 mg/dL (ref 65–99)
Potassium: 4.1 mmol/L (ref 3.5–5.1)
Sodium: 137 mmol/L (ref 135–145)

## 2015-11-20 LAB — CBC
HCT: 37.7 % (ref 36.0–46.0)
Hemoglobin: 12.5 g/dL (ref 12.0–15.0)
MCH: 28.5 pg (ref 26.0–34.0)
MCHC: 33.2 g/dL (ref 30.0–36.0)
MCV: 85.9 fL (ref 78.0–100.0)
PLATELETS: 281 10*3/uL (ref 150–400)
RBC: 4.39 MIL/uL (ref 3.87–5.11)
RDW: 13.3 % (ref 11.5–15.5)
WBC: 10.1 10*3/uL (ref 4.0–10.5)

## 2015-11-20 LAB — PREGNANCY, URINE: Preg Test, Ur: NEGATIVE

## 2015-11-20 SURGERY — LIGATION, FALLOPIAN TUBE, LAPAROSCOPIC
Anesthesia: General | Laterality: Bilateral

## 2015-11-20 MED ORDER — MIDAZOLAM HCL 2 MG/2ML IJ SOLN
INTRAMUSCULAR | Status: DC | PRN
  Administered 2015-11-20: 2 mg via INTRAVENOUS

## 2015-11-20 MED ORDER — KETOROLAC TROMETHAMINE 30 MG/ML IJ SOLN
INTRAMUSCULAR | Status: AC
  Filled 2015-11-20: qty 1

## 2015-11-20 MED ORDER — HYDROMORPHONE HCL 1 MG/ML IJ SOLN
INTRAMUSCULAR | Status: DC | PRN
  Administered 2015-11-20: 1 mg via INTRAVENOUS

## 2015-11-20 MED ORDER — ROCURONIUM BROMIDE 100 MG/10ML IV SOLN
INTRAVENOUS | Status: DC | PRN
  Administered 2015-11-20: 40 mg via INTRAVENOUS

## 2015-11-20 MED ORDER — BUPIVACAINE HCL (PF) 0.25 % IJ SOLN
INTRAMUSCULAR | Status: DC | PRN
  Administered 2015-11-20: 30 mL

## 2015-11-20 MED ORDER — DEXAMETHASONE SODIUM PHOSPHATE 10 MG/ML IJ SOLN
INTRAMUSCULAR | Status: AC
  Filled 2015-11-20: qty 1

## 2015-11-20 MED ORDER — KETOROLAC TROMETHAMINE 30 MG/ML IJ SOLN
INTRAMUSCULAR | Status: DC | PRN
  Administered 2015-11-20: 30 mg via INTRAVENOUS

## 2015-11-20 MED ORDER — ONDANSETRON HCL 4 MG/2ML IJ SOLN
INTRAMUSCULAR | Status: DC | PRN
  Administered 2015-11-20: 4 mg via INTRAVENOUS

## 2015-11-20 MED ORDER — ROCURONIUM BROMIDE 100 MG/10ML IV SOLN
INTRAVENOUS | Status: AC
  Filled 2015-11-20: qty 1

## 2015-11-20 MED ORDER — ONDANSETRON HCL 4 MG/2ML IJ SOLN
INTRAMUSCULAR | Status: AC
  Filled 2015-11-20: qty 2

## 2015-11-20 MED ORDER — HYDROMORPHONE HCL 1 MG/ML IJ SOLN: 0.2500 mg | INTRAMUSCULAR | Status: DC | PRN

## 2015-11-20 MED ORDER — BUPIVACAINE HCL (PF) 0.25 % IJ SOLN
INTRAMUSCULAR | Status: AC
  Filled 2015-11-20: qty 30

## 2015-11-20 MED ORDER — METOCLOPRAMIDE HCL 5 MG/ML IJ SOLN: 10.0000 mg | Freq: Once | INTRAMUSCULAR | Status: DC | PRN

## 2015-11-20 MED ORDER — NEOSTIGMINE METHYLSULFATE 10 MG/10ML IV SOLN
INTRAVENOUS | Status: AC
  Filled 2015-11-20: qty 1

## 2015-11-20 MED ORDER — GLYCOPYRROLATE 0.2 MG/ML IJ SOLN
INTRAMUSCULAR | Status: DC | PRN
  Administered 2015-11-20: 0.1 mg via INTRAVENOUS
  Administered 2015-11-20: .8 mg via INTRAVENOUS

## 2015-11-20 MED ORDER — SCOPOLAMINE 1 MG/3DAYS TD PT72
1.0000 | MEDICATED_PATCH | Freq: Once | TRANSDERMAL | Status: DC
  Administered 2015-11-20: 1.5 mg via TRANSDERMAL

## 2015-11-20 MED ORDER — DEXAMETHASONE SODIUM PHOSPHATE 4 MG/ML IJ SOLN
INTRAMUSCULAR | Status: DC | PRN
  Administered 2015-11-20: 4 mg via INTRAVENOUS

## 2015-11-20 MED ORDER — FENTANYL CITRATE (PF) 250 MCG/5ML IJ SOLN
INTRAMUSCULAR | Status: AC
  Filled 2015-11-20: qty 5

## 2015-11-20 MED ORDER — LACTATED RINGERS IV SOLN
INTRAVENOUS | Status: DC
  Administered 2015-11-20: 09:00:00 via INTRAVENOUS
  Administered 2015-11-20: 75 mL/h via INTRAVENOUS

## 2015-11-20 MED ORDER — HYDROCODONE-ACETAMINOPHEN 7.5-325 MG PO TABS
1.0000 | ORAL_TABLET | Freq: Once | ORAL | Status: DC | PRN
Start: 2015-11-20 — End: 2015-11-20

## 2015-11-20 MED ORDER — LIDOCAINE HCL (CARDIAC) 20 MG/ML IV SOLN
INTRAVENOUS | Status: AC
  Filled 2015-11-20: qty 5

## 2015-11-20 MED ORDER — MEPERIDINE HCL 25 MG/ML IJ SOLN: 6.2500 mg | INTRAMUSCULAR | Status: DC | PRN

## 2015-11-20 MED ORDER — FENTANYL CITRATE (PF) 250 MCG/5ML IJ SOLN
INTRAMUSCULAR | Status: DC | PRN
  Administered 2015-11-20: 50 ug via INTRAVENOUS
  Administered 2015-11-20 (×2): 100 ug via INTRAVENOUS

## 2015-11-20 MED ORDER — PROPOFOL 10 MG/ML IV BOLUS
INTRAVENOUS | Status: AC
  Filled 2015-11-20: qty 20

## 2015-11-20 MED ORDER — HYDROCODONE-ACETAMINOPHEN 5-325 MG PO TABS: 1.0000 | ORAL_TABLET | Freq: Four times a day (QID) | ORAL | Status: DC | PRN

## 2015-11-20 MED ORDER — SODIUM CHLORIDE 0.9 % IJ SOLN
INTRAMUSCULAR | Status: AC
  Filled 2015-11-20: qty 10

## 2015-11-20 MED ORDER — FENTANYL CITRATE (PF) 100 MCG/2ML IJ SOLN
INTRAMUSCULAR | Status: AC
  Administered 2015-11-20: 50 ug via INTRAVENOUS
  Filled 2015-11-20: qty 2

## 2015-11-20 MED ORDER — NEOSTIGMINE METHYLSULFATE 10 MG/10ML IV SOLN
INTRAVENOUS | Status: DC | PRN
  Administered 2015-11-20: 4 mg via INTRAVENOUS

## 2015-11-20 MED ORDER — PROPOFOL 10 MG/ML IV BOLUS
INTRAVENOUS | Status: DC | PRN
  Administered 2015-11-20: 150 mg via INTRAVENOUS

## 2015-11-20 MED ORDER — FENTANYL CITRATE (PF) 100 MCG/2ML IJ SOLN
25.0000 ug | INTRAMUSCULAR | Status: DC | PRN
  Administered 2015-11-20: 50 ug via INTRAVENOUS
  Administered 2015-11-20: 25 ug via INTRAVENOUS

## 2015-11-20 MED ORDER — LIDOCAINE HCL (CARDIAC) 20 MG/ML IV SOLN
INTRAVENOUS | Status: DC | PRN
  Administered 2015-11-20: 80 mg via INTRAVENOUS

## 2015-11-20 MED ORDER — HYDROMORPHONE HCL 1 MG/ML IJ SOLN
INTRAMUSCULAR | Status: AC
  Filled 2015-11-20: qty 1

## 2015-11-20 MED ORDER — SCOPOLAMINE 1 MG/3DAYS TD PT72
MEDICATED_PATCH | TRANSDERMAL | Status: AC
  Administered 2015-11-20: 1.5 mg via TRANSDERMAL
  Filled 2015-11-20: qty 1

## 2015-11-20 MED ORDER — MIDAZOLAM HCL 2 MG/2ML IJ SOLN
INTRAMUSCULAR | Status: AC
  Filled 2015-11-20: qty 2

## 2015-11-20 SURGICAL SUPPLY — 27 items
APPLICATOR COTTON TIP 6IN STRL (MISCELLANEOUS) ×9 IMPLANT
CATH ROBINSON RED A/P 16FR (CATHETERS) IMPLANT
CHLORAPREP W/TINT 26ML (MISCELLANEOUS) ×3 IMPLANT
CLOTH BEACON ORANGE TIMEOUT ST (SAFETY) ×3 IMPLANT
DRSG COVADERM PLUS 2X2 (GAUZE/BANDAGES/DRESSINGS) ×6 IMPLANT
DRSG OPSITE POSTOP 3X4 (GAUZE/BANDAGES/DRESSINGS) ×3 IMPLANT
GLOVE BIO SURGEON STRL SZ8 (GLOVE) ×3 IMPLANT
GLOVE BIOGEL PI IND STRL 7.0 (GLOVE) ×1 IMPLANT
GLOVE BIOGEL PI INDICATOR 7.0 (GLOVE) ×2
GLOVE LATEX FREE NEOLON SZ 8 (GLOVE) ×3 IMPLANT
GLOVE ORTHO TXT STRL SZ7.5 (GLOVE) IMPLANT
GOWN STRL REUS W/TWL LRG LVL3 (GOWN DISPOSABLE) ×6 IMPLANT
LIQUID BAND (GAUZE/BANDAGES/DRESSINGS) ×3 IMPLANT
NEEDLE INSUFFLATION 120MM (ENDOMECHANICALS) ×3 IMPLANT
PACK LAPAROSCOPY BASIN (CUSTOM PROCEDURE TRAY) ×3 IMPLANT
PAD POSITIONING PINK XL (MISCELLANEOUS) ×3 IMPLANT
SLEEVE XCEL OPT CAN 5 100 (ENDOMECHANICALS) ×3 IMPLANT
SUT VIC AB 3-0 CTX 36 (SUTURE) IMPLANT
SUT VIC AB 3-0 PS2 18 (SUTURE) ×2
SUT VIC AB 3-0 PS2 18XBRD (SUTURE) ×1 IMPLANT
SUT VICRYL 0 UR6 27IN ABS (SUTURE) ×3 IMPLANT
SYR 5ML LL (SYRINGE) ×3 IMPLANT
TOWEL OR 17X24 6PK STRL BLUE (TOWEL DISPOSABLE) ×6 IMPLANT
TROCAR XCEL NON-BLD 11X100MML (ENDOMECHANICALS) ×3 IMPLANT
TROCAR XCEL NON-BLD 5MMX100MML (ENDOMECHANICALS) ×3 IMPLANT
WARMER LAPAROSCOPE (MISCELLANEOUS) ×3 IMPLANT
WATER STERILE IRR 1000ML POUR (IV SOLUTION) ×3 IMPLANT

## 2015-11-20 NOTE — Anesthesia Procedure Notes (Signed)
Procedure Name: Intubation Date/Time: 11/20/2015 8:28 AM Performed by: Graciela HusbandsFUSSELL, Pike Scantlebury O Pre-anesthesia Checklist: Patient being monitored, Suction available, Emergency Drugs available, Patient identified and Timeout performed Patient Re-evaluated:Patient Re-evaluated prior to inductionOxygen Delivery Method: Circle system utilized Preoxygenation: Pre-oxygenation with 100% oxygen Intubation Type: IV induction Ventilation: Mask ventilation without difficulty Laryngoscope Size: Mac and 3 Grade View: Grade I Tube size: 6.5 mm Number of attempts: 1 Airway Equipment and Method: Patient positioned with wedge pillow and Stylet Placement Confirmation: ETT inserted through vocal cords under direct vision,  positive ETCO2 and breath sounds checked- equal and bilateral Secured at: 20 cm Tube secured with: Tape Dental Injury: Teeth and Oropharynx as per pre-operative assessment

## 2015-11-20 NOTE — Anesthesia Postprocedure Evaluation (Signed)
Anesthesia Post Note  Patient: Kristi Davies  Procedure(s) Performed: Procedure(s) (LRB): LAPAROSCOPIC TUBAL LIGATION (Bilateral)  Patient location during evaluation: PACU Anesthesia Type: General Level of consciousness: awake and alert Pain management: pain level controlled Vital Signs Assessment: post-procedure vital signs reviewed and stable Respiratory status: spontaneous breathing, nonlabored ventilation, respiratory function stable and patient connected to nasal cannula oxygen Cardiovascular status: blood pressure returned to baseline and stable Postop Assessment: no signs of nausea or vomiting Anesthetic complications: no    Last Vitals:  Filed Vitals:   11/20/15 0930 11/20/15 0945  BP: 116/76   Pulse: 92 85  Temp:    Resp: 10 16    Last Pain: There were no vitals filed for this visit.               [name]

## 2015-11-20 NOTE — Interval H&P Note (Signed)
History and Physical Interval Note:  11/20/2015 8:03 AM  Kristi Davies  has presented today for surgery, with the diagnosis of Sterilization  The various methods of treatment have been discussed with the patient and family. After consideration of risks, benefits and other options for treatment, the patient has consented to  Procedure(s): LAPAROSCOPIC TUBAL LIGATION (Bilateral) as a surgical intervention .  The patient's history has been reviewed, patient examined, no change in status, stable for surgery.  I have reviewed the patient's chart and labs.  Questions were answered to the patient's satisfaction.     Kenan Moodie D

## 2015-11-20 NOTE — Discharge Instructions (Signed)
Routine instructions for laparoscopy  DISCHARGE INSTRUCTIONS: Laparoscopy  The following instructions have been prepared to help you care for yourself upon your return home today.  Wound care:  Do not get the incision wet for the first 24 hours. The incision should be kept clean and dry.  The Band-Aids or dressings may be removed the day after surgery.  Should the incision become sore, red, and swollen after the first week, check with your doctor.  Personal hygiene:  Shower the day after your procedure.  Activity and limitations:  Do NOT drive or operate any equipment today.  Do NOT lift anything more than 15 pounds for 2-3 weeks after surgery.  Do NOT rest in bed all day.  Walking is encouraged. Walk each day, starting slowly with 5-minute walks 3 or 4 times a day. Slowly increase the length of your walks.  Walk up and down stairs slowly.  Do NOT do strenuous activities, such as golfing, playing tennis, bowling, running, biking, weight lifting, gardening, mowing, or vacuuming for 2-4 weeks. Ask your doctor when it is okay to start.  Diet: Eat a light meal as desired this evening. You may resume your usual diet tomorrow.  Return to work: This is dependent on the type of work you do. For the most part you can return to a desk job within a week of surgery. If you are more active at work, please discuss this with your doctor.  What to expect after your surgery: You may have a slight burning sensation when you urinate on the first day. You may have a very small amount of blood in the urine. Expect to have a small amount of vaginal discharge/light bleeding for 1-2 weeks. It is not unusual to have abdominal soreness and bruising for up to 2 weeks. You may be tired and need more rest for about 1 week. You may experience shoulder pain for 24-72 hours. Lying flat in bed may relieve it.  NO IBUPROFEN PRODUCTS (MOTRIN, ADVIL) OR ALEVE UNTIL 3:00PM TODAY.   Scope patch may be removed on  or before 11/23/15   Call your doctor for any of the following:  Develop a fever of 100.4 or greater  Inability to urinate 6 hours after discharge from hospital  Severe pain not relieved by pain medications  Persistent of heavy bleeding at incision site  Redness or swelling around incision site after a week  Increasing nausea or vomiting  Patient Signature________________________________________ Nurse Signature_________________________________________

## 2015-11-20 NOTE — Transfer of Care (Signed)
Immediate Anesthesia Transfer of Care Note  Patient: Kristi Davies  Procedure(s) Performed: Procedure(s): LAPAROSCOPIC TUBAL LIGATION (Bilateral)  Patient Location: PACU  Anesthesia Type:General  Level of Consciousness: awake  Airway & Oxygen Therapy: Patient Spontanous Breathing  Post-op Assessment: Report given to PACU RN  Post vital signs: stable  Filed Vitals:   11/20/15 0714  BP: 126/88  Pulse: 94  Temp: 36.8 C  Resp: 12    Complications: No apparent anesthesia complications

## 2015-11-20 NOTE — Op Note (Signed)
Preoperative diagnosis: Desires surgical sterility Postoperative diagnosis: Same Procedure: Laparoscopic bilateral salpingectomy Surgeon: Lavina Hammanodd Kyiesha Millward M.D. Anesthesia: Gen. Endotracheal tube Findings: She had a normal abdomen and pelvis with normal uterus tubes and ovaries Specimens: Bilateral fallopian tubes Estimated blood loss: Minimal Complications: None  Procedure in detail  The patient was taken to the operating room and placed in the dorsosupine position. General anesthesia was induced. Her legs were placed in mobile stirrups and her left arm was tucked to her side. Abdomen perineum and vagina were then prepped and draped in the usual sterile fashion, bladder drained with a latex free catheter, a single tooth tenaculum and acorn cannula was applied to the cervix for uterine manipulation. Infraumbilical skin was then infiltrated with quarter percent Marcaine and a 1 cm vertical incision was made. The veress needle was inserted into the peritoneal cavity and placement confirmed by the water drop test and an opening pressure of 7 mm of mercury. CO2 was insufflated to a pressure of 14 mm of mercury and the veress needle was removed. A 10/11 disposable trocar was then introduced with direct visualization with the laparoscope. A 5 mm port was then placed low in the midline also under direct visualization. Inspection revealed the above-mentioned findings with normal anatomy. The distal end of each tube was grasped and elevated. Using bipolar cautery I was able to free the distal end of each tube from the ovary and fulgurate across the mesosalpinx and a proximal portion of the fallopian tube. Scissors were then used to remove the fallopian tube. A small amount of bleeding from the right side was controlled with bipolar cautery. This is done bilaterally without difficulty. Both segments of tube were removed through the umbilical trocar. The 5 mm port was removed under direct visualization. All gas was  allowed to deflate from the abdomen and the umbilical trocar was removed. One figure-of-eight suture of 0 Vicryl was placed in the umbilical incision. Skin incisions were then closed with interrupted subcuticular sutures of 4-0 Vicryl followed by Dermabond. The tenaculum and acorn cannula were removed. The patient was taken down from stirrups. She was awakened in the operating room and taken to the recovery room in stable condition after tolerating the procedure well. Counts were correct and she had PAS hose on throughout the procedure.

## 2015-11-21 ENCOUNTER — Encounter (HOSPITAL_COMMUNITY): Payer: Self-pay | Admitting: Obstetrics and Gynecology

## 2015-11-28 ENCOUNTER — Ambulatory Visit: Payer: BLUE CROSS/BLUE SHIELD

## 2015-11-28 ENCOUNTER — Other Ambulatory Visit (INDEPENDENT_AMBULATORY_CARE_PROVIDER_SITE_OTHER): Payer: BLUE CROSS/BLUE SHIELD

## 2015-11-28 VITALS — BP 136/94

## 2015-11-28 DIAGNOSIS — E282 Polycystic ovarian syndrome: Secondary | ICD-10-CM | POA: Diagnosis not present

## 2015-11-28 DIAGNOSIS — Z0130 Encounter for examination of blood pressure without abnormal findings: Secondary | ICD-10-CM

## 2015-11-28 LAB — POTASSIUM: POTASSIUM: 3.9 meq/L (ref 3.5–5.1)

## 2016-05-06 ENCOUNTER — Ambulatory Visit (INDEPENDENT_AMBULATORY_CARE_PROVIDER_SITE_OTHER): Payer: BLUE CROSS/BLUE SHIELD | Admitting: Physician Assistant

## 2016-05-06 VITALS — BP 128/80 | HR 98 | Temp 98.0°F | Resp 17 | Ht 63.5 in | Wt 195.0 lb

## 2016-05-06 DIAGNOSIS — N3000 Acute cystitis without hematuria: Secondary | ICD-10-CM | POA: Diagnosis not present

## 2016-05-06 DIAGNOSIS — R300 Dysuria: Secondary | ICD-10-CM

## 2016-05-06 LAB — POC MICROSCOPIC URINALYSIS (UMFC): Mucus: ABSENT

## 2016-05-06 LAB — POCT URINALYSIS DIP (MANUAL ENTRY)
BILIRUBIN UA: NEGATIVE
Blood, UA: NEGATIVE
GLUCOSE UA: NEGATIVE
Ketones, POC UA: NEGATIVE
NITRITE UA: POSITIVE — AB
PH UA: 5.5
Protein Ur, POC: NEGATIVE
Spec Grav, UA: 1.01
Urobilinogen, UA: 0.2

## 2016-05-06 MED ORDER — CEPHALEXIN 500 MG PO CAPS: 500.0000 mg | ORAL_CAPSULE | Freq: Three times a day (TID) | ORAL | Status: AC

## 2016-05-06 NOTE — Patient Instructions (Signed)
     IF you received an x-ray today, you will receive an invoice from [facility]. Please contact [facility] at [phone] with questions or concerns regarding your invoice.   IF you received labwork today, you will receive an invoice from [facility]. Please contact [facility] at [phone] with questions or concerns regarding your invoice.   Our billing staff will not be able to assist you with questions regarding bills from these companies.  You will be contacted with the lab results as soon as they are available. The fastest way to get your results is to activate your My Chart account. Instructions are located on the last page of this paperwork. If you have not heard from us regarding the results in 2 weeks, please contact this office.      

## 2016-05-06 NOTE — Progress Notes (Signed)
   Kristi PickCarrie Davies  MRN: [id number] DOB: 04-Nov-1976  Subjective:  Pt presents to clinic with cloudy urine and malodorous urine for the last 2 days - 2 weeks ago she felt like she had a UTI and was seen at another UC and was told her urine was clear but put her on macrobid for 7 days - she did not feel like her symptoms improved until it got worse about 2 days ago.  She has an appt with urologist for frequent UTIs - her appt is in July - she has seen her GYN for multiple UTIs - she uses scent free soaps/detergent, cotton underwear,   Sexually active - no change in partner - sex with men - no anal involvement, showers and urinates after sex No menses - she has had an ablation  Patient Active Problem List   Diagnosis Date Noted  . PCOS (polycystic ovarian syndrome) 11/12/2015  . Menorrhagia 08/30/2013  . Dysmenorrhea 08/30/2013  . SUI (stress urinary incontinence, female) 08/30/2013    Current Outpatient Prescriptions on File Prior to Visit  Medication Sig Dispense Refill  . FLOVENT HFA 44 MCG/ACT inhaler Inhale 2 puffs into the lungs 2 (two) times daily.  5  . lamoTRIgine (LAMICTAL) 150 MG tablet Take 150 mg by mouth daily.    . metFORMIN (GLUCOPHAGE) 1000 MG tablet Take 1,000 mg by mouth daily with breakfast.    . montelukast (SINGULAIR) 10 MG tablet Take 10 mg by mouth at bedtime.    Marland Kitchen. QUEtiapine (SEROQUEL) 100 MG tablet Take 100 mg by mouth at bedtime.    Marland Kitchen. spironolactone (ALDACTONE) 50 MG tablet Take 1 tablet (50 mg total) by mouth daily. 60 tablet 2   No current facility-administered medications on file prior to visit.    Allergies  Allergen Reactions  . Latex Itching  . Levaquin [Levofloxacin In D5w] Hives  . Sulfa Antibiotics Rash    Review of Systems  Constitutional: Negative for fever and chills.  Gastrointestinal: Positive for abdominal pain (pressure). Negative for nausea and vomiting.  Genitourinary: Positive for dysuria, urgency and frequency. Negative for vaginal  discharge.   Objective:  BP 128/80 mmHg  Pulse 98  Temp(Src) 98 F (36.7 C) (Oral)  Resp 17  Ht 5' 3.5" (1.613 m)  Wt 195 lb (88.451 kg)  BMI 34.00 kg/m2  SpO2 96%  Physical Exam  Constitutional: She is oriented to person, place, and time and well-developed, well-nourished, and in no distress.  HENT:  Head: Normocephalic and atraumatic.  Right Ear: External ear normal.  Left Ear: External ear normal.  Cardiovascular: Normal rate, regular rhythm and normal heart sounds.   No murmur heard. Pulmonary/Chest: Effort normal and breath sounds normal.  Abdominal: Soft. There is tenderness in the suprapubic area. There is no CVA tenderness.  Neurological: She is alert and oriented to person, place, and time. Gait normal.  Skin: Skin is warm and dry.  Psychiatric: Mood, memory, affect and judgment normal.  Vitals reviewed.   Assessment and Plan :  Dysuria - Plan: POCT Microscopic Urinalysis ([facility]), POCT urinalysis dipstick, Urine culture  Acute cystitis without hematuria - Plan: cephALEXin (KEFLEX) 500 MG capsule, Care order/instruction   F/u with urologist - push fluids  Benny LennertSarah Liv Rallis PA-C  [facility] 05/06/2016 4:29 PM

## 2016-05-12 ENCOUNTER — Ambulatory Visit: Payer: BLUE CROSS/BLUE SHIELD | Admitting: Internal Medicine

## 2016-05-18 ENCOUNTER — Other Ambulatory Visit: Payer: Self-pay | Admitting: Internal Medicine

## 2016-07-06 ENCOUNTER — Ambulatory Visit (INDEPENDENT_AMBULATORY_CARE_PROVIDER_SITE_OTHER): Payer: BLUE CROSS/BLUE SHIELD | Admitting: Internal Medicine

## 2016-07-06 ENCOUNTER — Encounter: Payer: Self-pay | Admitting: Internal Medicine

## 2016-07-06 VITALS — BP 124/84 | HR 88 | Ht 64.0 in | Wt 197.0 lb

## 2016-07-06 DIAGNOSIS — E282 Polycystic ovarian syndrome: Secondary | ICD-10-CM

## 2016-07-06 MED ORDER — METFORMIN HCL 1000 MG PO TABS: 1000.0000 mg | ORAL_TABLET | Freq: Two times a day (BID) | ORAL | 3 refills | Status: DC

## 2016-07-06 MED ORDER — SPIRONOLACTONE 50 MG PO TABS: ORAL_TABLET | ORAL | 3 refills | Status: DC

## 2016-07-06 NOTE — Progress Notes (Signed)
Patient ID: Kristi Davies, female   DOB: [birth year], 40 y.o.   MRN: [id number]  HPI: Kristi Davies is a 40 y.o. female, returning for f/u for PCOS. Last visit 8 mo ago.  Pt was dx with PCOS several years ago: hirsutism, acne, weight gain. She was started on OCP in 2012. Hirsutism and acne greatly improved on the OCPs. After she stopped OCPs 6 months ago (after salpingectomy) >> noticed a little bit more acne and scalp hair loss. This is slowing down now. At last visit, we started Spironolactone 50 mg daily. She did not increase to twice a day.  She had a great weight response to Metformin >> lost 20 lbs in 1 mo after she started and more weight loss after increasing the dose to 1000 mg daily >> lost 8 more lbs. However, she feels that she is not losing any more weight now, despite increased exercise and eating better.  She is now on: - Metformin ER 500 mg daily - Stopped Yaz >> had B tubal ligation.  - Spironolactone 50 mg bid - did not try Vaniqua (sensitive skin)  Weight gain: - over the years - + steroid use - Prednisone in the past >> moody, fluid retention - has to use steroids 1x a year (asthma) - no weight loss meds - sciatica >> not a lot of exercise; now martial arts - Drinks: soda 1x a week  Fertility/Menstrual cycles: - regular menses initially (but heavy), then stopped after endom. ablation (2014) after she bled for 1 mo - no h/o ovarian cysts - children: 1: 48 y/o - miscarriages: no - contraception: Yaz >> stopped after she had salpingectomy, 6 mo ago  Acne: - better: cystic, now only scarring - chin, shoulders  Hirsutism: - sideburns and upper lip  - Last thyroid tests: Lab Results  Component Value Date   TSH 3.31 10/17/2015   - Last HbA1c: Lab Results  Component Value Date   HGBA1C 5.6 10/17/2015   She has FH of DM in both parents.   ROS: Constitutional: + weight gain, no fatigue, no subjective hyperthermia/hypothermia Eyes: no blurry vision, no  xerophthalmia ENT: no sore throat, no nodules palpated in throat, no dysphagia/odynophagia, no hoarseness Cardiovascular: no CP/SOB/palpitations/leg swelling Respiratory: no cough/SOB Gastrointestinal: no N/V/D/C Musculoskeletal: no muscle/joint aches Skin: no rashes, + Acne, + hirsutism  Neurological: no tremors/numbness/tingling/dizziness  I reviewed pt's medications, allergies, PMH, social hx, family hx, and changes were documented in the history of present illness. Otherwise, unchanged from my initial visit note.  Past Medical History:  Diagnosis Date  . Allergy   . Asthma   . Bipolar disorder (HCC)    Type 2  . Depression   . Headache    Migraines  . IBS (irritable bowel syndrome)   . Insomnia   . PCOS (polycystic ovarian syndrome)   . Pneumonia    history of  . Vitamin D deficiency    Past Surgical History:  Procedure Laterality Date  . BLADDER SUSPENSION N/A 08/30/2013   Procedure: TRANSVAGINAL TAPE (TVT) PROCEDURE;  Surgeon: Lavina Hamman, MD;  Location: [facility];  Service: Gynecology;  Laterality: N/A;  . EYE SURGERY    . HYSTEROSCOPY WITH NOVASURE N/A 08/30/2013   Procedure: HYSTEROSCOPY WITH NOVASURE;  Surgeon: Lavina Hamman, MD;  Location: [facility];  Service: Gynecology;  Laterality: N/A;  1 1/2 hrs OR time  . LAPAROSCOPIC TUBAL LIGATION Bilateral 11/20/2015   Procedure: LAPAROSCOPIC TUBAL LIGATION;  Surgeon: Lavina Hamman, MD;  Location: [facility];  Service: Gynecology;  Laterality: Bilateral;   Social History   Social History  . Marital Status: Single    Spouse Name: N/A  . Number of Children: 1    Social History Main Topics  . Smoking status: Never Smoker   . Smokeless tobacco: Not on file  . Alcohol Use: 0.0 oz/week    0 Standard drinks or equivalent per week  . Drug Use: No  . Sexual Activity: Yes    Birth Control/ Protection: Other-see comments     Comment: vasectomy   Social History Narrative   Single   Self-employed   1 daughter   Exercise: yes    Caffeine use: yes      Current Outpatient Prescriptions on File Prior to Visit  Medication Sig Dispense Refill  . clonazePAM (KLONOPIN) 0.5 MG tablet TAKE 1 TABLET BY MOUTH 2 TIMES DAILY AS NEEDED FOR ANXIETY  0  . FLOVENT HFA 44 MCG/ACT inhaler Inhale 2 puffs into the lungs 2 (two) times daily.  5  . lamoTRIgine (LAMICTAL) 150 MG tablet Take 150 mg by mouth daily.    . metFORMIN (GLUCOPHAGE) 1000 MG tablet Take 1,000 mg by mouth daily with breakfast.    . montelukast (SINGULAIR) 10 MG tablet Take 10 mg by mouth at bedtime.    Marland Kitchen QUEtiapine (SEROQUEL) 100 MG tablet Take 100 mg by mouth at bedtime.    Marland Kitchen spironolactone (ALDACTONE) 50 MG tablet TAKE 1 TABLET (50 MG TOTAL) BY MOUTH DAILY. 60 tablet 2   No current facility-administered medications on file prior to visit.    Allergies  Allergen Reactions  . Latex Itching  . Levaquin [Levofloxacin In D5w] Hives  . Sulfa Antibiotics Rash   Family History  Problem Relation Age of Onset  . Depression Mother   . Diabetes Mother   . Diabetes Father   . Heart disease Father   . Dementia Maternal Grandmother   . Heart disease Maternal Grandfather   . Dementia Paternal Grandmother   . Arthritis Paternal Grandfather    PE: BP 124/84 (BP Location: Left Arm, Patient Position: Sitting)   Pulse 88   Ht  (1.626 m)   Wt 197 lb (89.4 kg)   SpO2 95%   BMI 33.81 kg/m  Wt Readings from Last 3 Encounters:  07/06/16 197 lb (89.4 kg)  05/06/16 195 lb (88.5 kg)  11/12/15 194 lb (88 kg)   Constitutional: overweight, in NAD, no full supraclavicular fat pads Eyes: PERRLA, EOMI, no exophthalmos ENT: moist mucous membranes, no thyromegaly, no cervical lymphadenopathy Cardiovascular:  RRR, No MRG Respiratory: CTA B Gastrointestinal: abdomen soft, NT, ND, BS+ Musculoskeletal: no deformities, strength intact in all 4 Skin: moist, warm; + acne on face,  no dark terminal hair on chin,  no vellum on sideburns, no skin tags, very faint acanthosis  nigricans, no purple, wide, stretch marks Neurological: no tremor with outstretched hands, DTR normal in all 4  ASSESSMENT: 1. PCOS  PLAN: 1.  - Patient with acne and hirsutism, now off oral contraceptives for 6 months. We started spironolactone 50 mg daily and she did not increase to twice a day. Since she is having more acne and some scalp hair loss, we will increase her spironolactone to 50 ng twice a day. We discussed about possible orthostatic hypotension and hyperkalemia. Will check a potassium today and she will return in a week for a nurse visit for blood pressure and the repeat potassium level. -  we will also check an HbA1c today.  Last hemoglobin A1c was excellent at 5.6%. No signs of prediabetes or diabetes.  She does have a family history of diabetes, though. -  she had a good response to metformin ER , but her weight is stagnant now and we discussed about increasing metformin to 1000 mg twice a day -  I will see her back in 6 months  Component     Latest Ref Rng & Units 07/06/2016  Potassium     3.5 - 5.1 mEq/L 4.5  Hemoglobin A1C     4.6 - 6.5 % 5.5  We can go ahead with Increasing the spironolactone as her potassium is normal. HbA1c is great.

## 2016-07-06 NOTE — Patient Instructions (Addendum)
Please increase: - Metformin to 1000 mg 2x a day - Spironolactone to 50 mg 2x a day  Please come back for labs and a nurse visit for in 1 week.  Please come back for a follow-up appointment in 6 months.

## 2016-07-07 LAB — POTASSIUM: Potassium: 4.5 mEq/L (ref 3.5–5.1)

## 2016-07-07 LAB — HEMOGLOBIN A1C: HEMOGLOBIN A1C: 5.5 % (ref 4.6–6.5)

## 2016-07-15 ENCOUNTER — Other Ambulatory Visit: Payer: BLUE CROSS/BLUE SHIELD

## 2016-07-15 DIAGNOSIS — E282 Polycystic ovarian syndrome: Secondary | ICD-10-CM

## 2016-07-16 LAB — BASIC METABOLIC PANEL WITH GFR
BUN: 15 mg/dL (ref 7–25)
CALCIUM: 9.4 mg/dL (ref 8.6–10.2)
CHLORIDE: 103 mmol/L (ref 98–110)
CO2: 22 mmol/L (ref 20–31)
CREATININE: 0.79 mg/dL (ref 0.50–1.10)
GFR, Est African American: >89 mL/min (ref >=60)
GFR, Est Non African American: >89 mL/min (ref >=60)
Glucose, Bld: 93 mg/dL (ref 65–99)
Potassium: 4.9 mmol/L (ref 3.5–5.3)
SODIUM: 136 mmol/L (ref 135–146)

## 2016-11-12 ENCOUNTER — Ambulatory Visit (INDEPENDENT_AMBULATORY_CARE_PROVIDER_SITE_OTHER): Payer: BLUE CROSS/BLUE SHIELD | Admitting: Internal Medicine

## 2016-11-12 VITALS — BP 121/92 | HR 94 | Wt 189.0 lb

## 2016-11-12 DIAGNOSIS — E282 Polycystic ovarian syndrome: Secondary | ICD-10-CM | POA: Diagnosis not present

## 2016-11-12 DIAGNOSIS — R5383 Other fatigue: Secondary | ICD-10-CM

## 2016-11-12 LAB — VITAMIN D 25 HYDROXY (VIT D DEFICIENCY, FRACTURES): VITD: 42.95 ng/mL (ref 30.00–100.00)

## 2016-11-12 LAB — CBC
HCT: 41 % (ref 36.0–46.0)
Hemoglobin: 13.9 g/dL (ref 12.0–15.0)
MCHC: 33.8 g/dL (ref 30.0–36.0)
MCV: 86.6 fl (ref 78.0–100.0)
Platelets: 313 10*3/uL (ref 150.0–400.0)
RBC: 4.73 Mil/uL (ref 3.87–5.11)
RDW: 13.3 % (ref 11.5–15.5)
WBC: 11 10*3/uL — AB (ref 4.0–10.5)

## 2016-11-12 LAB — VITAMIN B12: Vitamin B-12: 364 pg/mL (ref 211–911)

## 2016-11-12 LAB — BASIC METABOLIC PANEL WITH GFR
BUN: 16 mg/dL (ref 7–25)
CALCIUM: 9.5 mg/dL (ref 8.6–10.2)
CO2: 25 mmol/L (ref 20–31)
CREATININE: 0.85 mg/dL (ref 0.50–1.10)
Chloride: 102 mmol/L (ref 98–110)
GFR, Est Non African American: 87 mL/min (ref >=60)
Glucose, Bld: 100 mg/dL — ABNORMAL HIGH (ref 65–99)
Potassium: 4.1 mmol/L (ref 3.5–5.3)
SODIUM: 138 mmol/L (ref 135–146)

## 2016-11-12 LAB — T4, FREE: FREE T4: 1.01 ng/dL (ref 0.60–1.60)

## 2016-11-12 LAB — T3, FREE: T3 FREE: 3.7 pg/mL (ref 2.3–4.2)

## 2016-11-12 LAB — TSH: TSH: 1.02 u[IU]/mL (ref 0.35–4.50)

## 2016-11-12 NOTE — Progress Notes (Signed)
Patient ID: Kristi BattlesCarrie Valliant, female   DOB: 1976-03-19, 40 y.o.   MRN: [id number]  HPI: Kristi Davies [pronounced:"shots"] is a 40 y.o. female, returning for f/u for PCOS. Last visit 4 mo ago.  She got married since last visit.  She has increased fatigue in last 2 months.  Reviewed and addended hx: Pt was dx with PCOS several years ago: hirsutism, acne, weight gain. She was started on OCP in 2012. Hirsutism and acne greatly improved on the OCPs. After she stopped OCPs (after salpingectomy 11/2015) >> noticed a little bit more acne and scalp hair loss.  We started Spironolactone 50 mg daily at the beginning of 2017, then increased to twice a day.  She had a great weight response to Metformin >> lost 20 lbs in 1 mo after she started and more weight loss after increasing the dose to 1000 mg daily. However, she stabilized in summer 2017 despite increased exercise and eating better.  She is now on: - Metformin ER 500 mg daily - Stopped Yaz >> had B tubal ligation.  - Spironolactone 50 mg bid - did not try Vaniqua (sensitive skin)  Weight gain: - over the years - + steroid use - Prednisone in the past >> moody, fluid retention - has to use steroids 1x a year (asthma) - no weight loss meds - exercise: martial arts  Fertility/Menstrual cycles: - regular menses initially (but heavy), then stopped after endom. ablation (2014) after she bled for 1 mo - no h/o ovarian cysts - children: 1: 39 y/o - miscarriages: no - contraception: Yaz >> stopped after she had salpingectomy, 11/2015  Acne: - better: cystic, now only scarring - chin, shoulders  Hirsutism: - sideburns and upper lip  - Last thyroid tests: Lab Results  Component Value Date   TSH 3.31 10/17/2015   - Last HbA1c: Lab Results  Component Value Date   HGBA1C 5.5 07/06/2016   She has FH of DM in both parents.   ROS: Constitutional: + weight loss, + fatigue, + subjective hypothermia Eyes: no blurry vision, no  xerophthalmia ENT: no sore throat, no nodules palpated in throat, no dysphagia/odynophagia, no hoarseness Cardiovascular: no CP/SOB/palpitations/leg swelling Respiratory: no cough/SOB Gastrointestinal: no N/V/D/+ C Musculoskeletal: no muscle/joint aches Skin: + rash, + Acne, + hirsutism  Neurological: no tremors/numbness/tingling/dizziness, + HA  I reviewed pt's medications, allergies, PMH, social hx, family hx, and changes were documented in the history of present illness. Otherwise, unchanged from my initial visit note.  Past Medical History:  Diagnosis Date  . Allergy   . Asthma   . Bipolar disorder (HCC)    Type 2  . Depression   . Headache    Migraines  . IBS (irritable bowel syndrome)   . Insomnia   . PCOS (polycystic ovarian syndrome)   . Pneumonia    history of  . Vitamin D deficiency    Past Surgical History:  Procedure Laterality Date  . BLADDER SUSPENSION N/A 08/30/2013   Procedure: TRANSVAGINAL TAPE (TVT) PROCEDURE;  Surgeon: Lavina Hammanodd Meisinger, MD;  Location: [facility];  Service: Gynecology;  Laterality: N/A;  . EYE SURGERY    . HYSTEROSCOPY WITH NOVASURE N/A 08/30/2013   Procedure: HYSTEROSCOPY WITH NOVASURE;  Surgeon: Lavina Hammanodd Meisinger, MD;  Location: [facility];  Service: Gynecology;  Laterality: N/A;  1 1/2 hrs OR time  . LAPAROSCOPIC TUBAL LIGATION Bilateral 11/20/2015   Procedure: LAPAROSCOPIC TUBAL LIGATION;  Surgeon: Lavina Hammanodd Meisinger, MD;  Location: [facility];  Service: Gynecology;  Laterality: Bilateral;   Social  History   Social History  . Marital Status: Single    Spouse Name: N/A  . Number of Children: 1    Social History Main Topics  . Smoking status: Never Smoker   . Smokeless tobacco: Not on file  . Alcohol Use: 0.0 oz/week    0 Standard drinks or equivalent per week  . Drug Use: No  . Sexual Activity: Yes    Birth Control/ Protection: Other-see comments     Comment: vasectomy   Social History Narrative   Single   Self-employed   1 daughter   Exercise:  yes   Caffeine use: yes      Current Outpatient Prescriptions on File Prior to Visit  Medication Sig Dispense Refill  . clonazePAM (KLONOPIN) 0.5 MG tablet TAKE 1 TABLET BY MOUTH 2 TIMES DAILY AS NEEDED FOR ANXIETY  0  . FLOVENT HFA 44 MCG/ACT inhaler Inhale 2 puffs into the lungs 2 (two) times daily.  5  . lamoTRIgine (LAMICTAL) 150 MG tablet Take 150 mg by mouth daily.    . metFORMIN (GLUCOPHAGE) 1000 MG tablet Take 1 tablet (1,000 mg total) by mouth 2 (two) times daily with a meal. 180 tablet 3  . montelukast (SINGULAIR) 10 MG tablet Take 10 mg by mouth at bedtime.    . nitrofurantoin (MACRODANTIN) 100 MG capsule Take 100 mg by mouth at bedtime.  11  . QUEtiapine (SEROQUEL) 100 MG tablet Take 100 mg by mouth at bedtime.    Marland Kitchen spironolactone (ALDACTONE) 50 MG tablet TAKE 1 TABLET (50 MG TOTAL) BY MOUTH 2x DAILY. 180 tablet 3   No current facility-administered medications on file prior to visit.    Allergies  Allergen Reactions  . Latex Itching  . Levaquin [Levofloxacin In D5w] Hives  . Sulfa Antibiotics Rash   Family History  Problem Relation Age of Onset  . Depression Mother   . Diabetes Mother   . Diabetes Father   . Heart disease Father   . Dementia Maternal Grandmother   . Heart disease Maternal Grandfather   . Dementia Paternal Grandmother   . Arthritis Paternal Grandfather    PE: BP (!) 121/92   Pulse 94   Wt 189 lb (85.7 kg)   BMI 32.44 kg/m  Wt Readings from Last 3 Encounters:  11/12/16 189 lb (85.7 kg)  07/06/16 197 lb (89.4 kg)  05/06/16 195 lb (88.5 kg)   Constitutional: overweight, in NAD, no full supraclavicular fat pads Eyes: PERRLA, EOMI, no exophthalmos ENT: moist mucous membranes, no thyromegaly, no cervical lymphadenopathy Cardiovascular:  RRR, No MRG Respiratory: CTA B Gastrointestinal: abdomen soft, NT, ND, BS+ Musculoskeletal: no deformities, strength intact in all 4 Skin: moist, warm; + acne on face,  no dark terminal hair on chin,  no vellum  on sideburns, no skin tags, very faint acanthosis nigricans, no purple, wide, stretch marks Neurological: no tremor with outstretched hands, DTR normal in all 4  ASSESSMENT: 1. PCOS  2. Fatigue  PLAN: 1.  - Patient with acne and hirsutism, now off oral contraceptives for 1 year. We started spironolactone 50 mg daily now twice a day. She did not develop hypotension and her potassium level was normal after the increase in dose. -  Last hemoglobin A1c was excellent at 5.5%. No signs of prediabetes or diabetes.  She does have a family history of diabetes, though. -  she had a good response to metformin ER, losing 8 more pounds since last visit  -  I will see her  back in 6 months  2. Fatigue - unexplained - we will check her for thyroid ds (she has FH of this in her mom), vitamin B12 and D def, potassium abnl., anemia: Orders Placed This Encounter  Procedures  . TSH  . T4, free  . T3, free  . Vitamin B12  . Vitamin D, 25-hydroxy  . BASIC METABOLIC PANEL WITH GFR  . CBC  If labs are normal, may need to decrease Spironolactone to 50 mg once a day.  Office Visit on 11/12/2016  Component Date Value Ref Range Status  . TSH 11/12/2016 1.02  0.35 - 4.50 uIU/mL Final  . Free T4 11/12/2016 1.01  0.60 - 1.60 ng/dL Final   Comment: Specimens from patients who are undergoing biotin therapy and /or ingesting biotin supplements may contain high levels of biotin.  The higher biotin concentration in these specimens interferes with this Free T4 assay.  Specimens that contain high levels  of biotin may cause false high results for this Free T4 assay.  Please interpret results in light of the total clinical presentation of the patient.    . T3, Free 11/12/2016 3.7  2.3 - 4.2 pg/mL Final  . Vitamin B-12 11/12/2016 364  211 - 911 pg/mL Final  . VITD 11/12/2016 42.95  30.00 - 100.00 ng/mL Final  . Sodium 11/12/2016 138  135 - 146 mmol/L Final  . Potassium 11/12/2016 4.1  3.5 - 5.3 mmol/L Final  .  Chloride 11/12/2016 102  98 - 110 mmol/L Final  . CO2 11/12/2016 25  20 - 31 mmol/L Final  . Glucose, Bld 11/12/2016 100* 65 - 99 mg/dL Final  . BUN 29/52/841312/06/2016 16  7 - 25 mg/dL Final  . Creat 24/40/102712/06/2016 0.85  0.50 - 1.10 mg/dL Final  . Calcium 25/36/644012/06/2016 9.5  8.6 - 10.2 mg/dL Final  . GFR, Est African American 11/12/2016 >89  >=60 mL/min Final  . GFR, Est Non African American 11/12/2016 87  >=60 mL/min Final  . WBC 11/12/2016 11.0* 4.0 - 10.5 K/uL Final  . RBC 11/12/2016 4.73  3.87 - 5.11 Mil/uL Final  . Platelets 11/12/2016 313.0  150.0 - 400.0 K/uL Final  . Hemoglobin 11/12/2016 13.9  12.0 - 15.0 g/dL Final  . HCT 34/74/259512/06/2016 41.0  36.0 - 46.0 % Final  . MCV 11/12/2016 86.6  78.0 - 100.0 fl Final  . MCHC 11/12/2016 33.8  30.0 - 36.0 g/dL Final  . RDW 63/87/564312/06/2016 13.3  11.5 - 15.5 % Final   Normal labs except slightly high white blood cell count. I will advise the patient to discuss with PCP. Will hold off decreasing the spironolactone dose for now.  Carlus Pavlovristina Brittain Smithey, MD PhD [facility]

## 2016-11-12 NOTE — Patient Instructions (Signed)
Please continue: - Metformin 1000 mg 2x a day - Spironolactone 50 mg 2x a day for now  Please stop at the lab.  Please come back for a follow-up appointment in 6 months.

## 2016-11-13 ENCOUNTER — Encounter: Payer: Self-pay | Admitting: Internal Medicine

## 2016-11-28 ENCOUNTER — Emergency Department (HOSPITAL_COMMUNITY)
Admission: EM | Admit: 2016-11-28 | Discharge: 2016-11-29 | Disposition: A | Discharge status: Home / Self Care | Payer: BLUE CROSS/BLUE SHIELD | Attending: Emergency Medicine | Admitting: Emergency Medicine

## 2016-11-28 ENCOUNTER — Encounter (HOSPITAL_COMMUNITY): Payer: Self-pay | Admitting: Emergency Medicine

## 2016-11-28 ENCOUNTER — Emergency Department (HOSPITAL_COMMUNITY): Payer: BLUE CROSS/BLUE SHIELD

## 2016-11-28 DIAGNOSIS — Z91040 Latex allergy status: Secondary | ICD-10-CM | POA: Diagnosis not present

## 2016-11-28 DIAGNOSIS — Z79899 Other long term (current) drug therapy: Secondary | ICD-10-CM | POA: Insufficient documentation

## 2016-11-28 DIAGNOSIS — R079 Chest pain, unspecified: Secondary | ICD-10-CM | POA: Diagnosis present

## 2016-11-28 DIAGNOSIS — J45909 Unspecified asthma, uncomplicated: Secondary | ICD-10-CM | POA: Insufficient documentation

## 2016-11-28 LAB — I-STAT TROPONIN, ED
Troponin i, poc: 0 ng/mL (ref 0.00–0.08)
Troponin i, poc: 0.01 ng/mL (ref 0.00–0.08)

## 2016-11-28 LAB — CBC
HCT: 41.6 % (ref 36.0–46.0)
Hemoglobin: 13.8 g/dL (ref 12.0–15.0)
MCH: 29.2 pg (ref 26.0–34.0)
MCHC: 33.2 g/dL (ref 30.0–36.0)
MCV: 87.9 fL (ref 78.0–100.0)
PLATELETS: 331 10*3/uL (ref 150–400)
RBC: 4.73 MIL/uL (ref 3.87–5.11)
RDW: 12.8 % (ref 11.5–15.5)
WBC: 9.8 10*3/uL (ref 4.0–10.5)

## 2016-11-28 LAB — D-DIMER, QUANTITATIVE: D-Dimer, Quant: <0.27 ug/mL-FEU (ref 0.00–0.50)

## 2016-11-28 LAB — BASIC METABOLIC PANEL
Anion gap: 12 (ref 5–15)
BUN: 19 mg/dL (ref 6–20)
CHLORIDE: 101 mmol/L (ref 101–111)
CO2: 25 mmol/L (ref 22–32)
CREATININE: 0.86 mg/dL (ref 0.44–1.00)
Calcium: 9.9 mg/dL (ref 8.9–10.3)
Glucose, Bld: 108 mg/dL — ABNORMAL HIGH (ref 65–99)
POTASSIUM: 4.3 mmol/L (ref 3.5–5.1)
SODIUM: 138 mmol/L (ref 135–145)

## 2016-11-28 MED ORDER — ONDANSETRON 4 MG PO TBDP
4.0000 mg | ORAL_TABLET | Freq: Once | ORAL | Status: AC
  Administered 2016-11-28: 4 mg via ORAL
  Filled 2016-11-28: qty 1

## 2016-11-28 NOTE — ED Notes (Signed)
Pt presents with chest tightness, pain radiating to L jaw.  Pt also endorses nausea, which is present now, dizziness, and SOB.  Reports fatigue x 1 month.  Also reports episode of palpitations with a heart rate 130s last night while wrapping presents.

## 2016-11-28 NOTE — ED Triage Notes (Signed)
Pt states "ive been having a lot of chest pain, nausea, dizziness, jaw pain since last night. Tonight I felt like my chest was tight, I have asthma normally and I took my inhaler and it only helped a little bit.". Pt also c/o fatigue.

## 2016-11-28 NOTE — Discharge Instructions (Signed)
If you were given medicines take as directed.  If you are on coumadin or contraceptives realize their levels and effectiveness is altered by many different medicines.  If you have any reaction (rash, tongues swelling, other) to the medicines stop taking and see a physician.    If your blood pressure was elevated in the ER make sure you follow up for management with a primary doctor or return for chest pain, shortness of breath or stroke symptoms.  Please follow up as directed and return to the ER or see a physician for new or worsening symptoms.  Thank you. Vitals:   11/28/16 1951 11/28/16 1954 11/28/16 2125 11/28/16 2130  BP: 126/88  126/90 121/88  Pulse: 100  92 89  Resp: 16  16 15   Temp: 98.5 F (36.9 C)     TempSrc: Oral     SpO2: 96%  97% 98%  Weight:  185 lb (83.9 kg)    Height:  5\' 3"  (1.6 m)

## 2016-11-28 NOTE — ED Provider Notes (Signed)
[facility]-EM[facility]ENCY DEPT Provider Note   CSN: [id number] Arrival date & time: 11/28/16  1946     History   Chief Complaint Chief Complaint  Patient presents with  . Chest Pain    HPI Kristi Davies is a 40 y.o. female.  Patient with history of asthma presents with anterior chest pain with mild radiation to the jaw along with nausea and dizziness. Patient denies history of similar. Patient has had asthma exacerbations over this is different. He denies any cardiac or blood clot history no blood clot risk factors. No significant family history of early coronary artery disease. Patient said mild improvement in her symptoms. Patient is on metformin for pcos      Past Medical History:  Diagnosis Date  . Allergy   . Asthma   . Bipolar disorder (HCC)    Type 2  . Depression   . Headache    Migraines  . IBS (irritable bowel syndrome)   . Insomnia   . PCOS (polycystic ovarian syndrome)   . Pneumonia    history of  . Vitamin D deficiency     Patient Active Problem List   Diagnosis Date Noted  . PCOS (polycystic ovarian syndrome) 11/12/2015  . Menorrhagia 08/30/2013  . Dysmenorrhea 08/30/2013  . SUI (stress urinary incontinence, female) 08/30/2013    Past Surgical History:  Procedure Laterality Date  . BLADDER SUSPENSION N/A 08/30/2013   Procedure: TRANSVAGINAL TAPE (TVT) PROCEDURE;  Surgeon: Lavina Hammanodd Meisinger, MD;  Location: [facility];  Service: Gynecology;  Laterality: N/A;  . EYE SURGERY    . HYSTEROSCOPY WITH NOVASURE N/A 08/30/2013   Procedure: HYSTEROSCOPY WITH NOVASURE;  Surgeon: Lavina Hammanodd Meisinger, MD;  Location: [facility];  Service: Gynecology;  Laterality: N/A;  1 1/2 hrs OR time  . LAPAROSCOPIC TUBAL LIGATION Bilateral 11/20/2015   Procedure: LAPAROSCOPIC TUBAL LIGATION;  Surgeon: Lavina Hammanodd Meisinger, MD;  Location: [facility];  Service: Gynecology;  Laterality: Bilateral;    OB History    No data available       Home Medications    Prior to Admission medications   Medication  Sig Start Date End Date Taking? Authorizing Provider  albuterol (PROVENTIL HFA;VENTOLIN HFA) 108 (90 Base) MCG/ACT inhaler Inhale 1-2 puffs into the lungs every 6 (six) hours as needed for wheezing or shortness of breath.   Yes Historical Provider, MD  clonazePAM (KLONOPIN) 0.5 MG tablet TAKE 1 TABLET BY MOUTH 2 TIMES DAILY AS NEEDED FOR ANXIETY 04/24/16  Yes Historical Provider, MD  cyclobenzaprine (FLEXERIL) 10 MG tablet Take 10 mg by mouth 3 (three) times daily as needed for muscle spasms.   Yes Historical Provider, MD  FLOVENT HFA 44 MCG/ACT inhaler Inhale 2 puffs into the lungs 2 (two) times daily. 10/14/15  Yes Historical Provider, MD  lamoTRIgine (LAMICTAL) 150 MG tablet Take 150 mg by mouth daily.   Yes Historical Provider, MD  metFORMIN (GLUCOPHAGE) 1000 MG tablet Take 1 tablet (1,000 mg total) by mouth 2 (two) times daily with a meal. 07/06/16  Yes Carlus Pavlovristina Gherghe, MD  montelukast (SINGULAIR) 10 MG tablet Take 10 mg by mouth at bedtime.   Yes Historical Provider, MD  nitrofurantoin (MACRODANTIN) 100 MG capsule Take 100 mg by mouth at bedtime. 06/15/16  Yes Historical Provider, MD  QUEtiapine (SEROQUEL) 100 MG tablet Take 100 mg by mouth at bedtime.   Yes Historical Provider, MD  spironolactone (ALDACTONE) 50 MG tablet TAKE 1 TABLET (50 MG TOTAL) BY MOUTH 2x DAILY. Patient taking differently: Take 50 mg by mouth 2 (  two) times daily. TAKE 1 TABLET (50 MG TOTAL) BY MOUTH 2x DAILY. 07/06/16  Yes Carlus Pavlovristina Gherghe, MD    Family History Family History  Problem Relation Age of Onset  . Depression Mother   . Diabetes Mother   . Diabetes Father   . Heart disease Father   . Dementia Maternal Grandmother   . Heart disease Maternal Grandfather   . Dementia Paternal Grandmother   . Arthritis Paternal Grandfather     Social History Social History  Substance Use Topics  . Smoking status: Never Smoker  . Smokeless tobacco: Never Used  . Alcohol use 0.0 oz/week     Allergies   Shrimp  [shellfish allergy]; Latex; Levaquin [levofloxacin in d5w]; and Sulfa antibiotics   Review of Systems Review of Systems  Constitutional: Positive for appetite change. Negative for chills and fever.  HENT: Negative for ear pain and sore throat.   Eyes: Negative for pain and visual disturbance.  Respiratory: Positive for shortness of breath. Negative for cough.   Cardiovascular: Positive for chest pain. Negative for palpitations and leg swelling.  Gastrointestinal: Positive for nausea. Negative for abdominal pain and vomiting.  Genitourinary: Negative for dysuria and hematuria.  Musculoskeletal: Negative for arthralgias and back pain.  Skin: Negative for color change and rash.  Neurological: Negative for seizures and syncope.  All other systems reviewed and are negative.    Physical Exam Updated Vital Signs BP 120/88   Pulse 94   Temp 98.5 F (36.9 C) (Oral)   Resp 20   Ht 5\' 3"  (1.6 m)   Wt 185 lb (83.9 kg)   SpO2 97%   BMI 32.77 kg/m   Physical Exam  Constitutional: She appears well-developed and well-nourished. No distress.  HENT:  Head: Normocephalic and atraumatic.  Eyes: Conjunctivae are normal.  Neck: Neck supple.  Cardiovascular: Regular rhythm and intact distal pulses.   No murmur heard. Pulmonary/Chest: Effort normal and breath sounds normal. No respiratory distress.  Abdominal: Soft. There is no tenderness.  Musculoskeletal: She exhibits no edema.  Neurological: She is alert.  Skin: Skin is warm and dry.  Psychiatric: She has a normal mood and affect.  Nursing note and vitals reviewed.    ED Treatments / Results  Labs (all labs ordered are listed, but only abnormal results are displayed) Labs Reviewed  BASIC METABOLIC PANEL - Abnormal; Notable for the following:       Result Value   Glucose, Bld 108 (*)    All other components within normal limits  CBC  D-DIMER, QUANTITATIVE (NOT [facility])  Rosezena SensorI-STAT TROPOININ, ED  I-STAT TROPOININ, ED    EKG  EKG  Interpretation  Date/Time:  Saturday November 28 2016 19:52:34 EST Ventricular Rate:  104 PR Interval:  130 QRS Duration: 86 QT Interval:  350 QTC Calculation: 460 R Axis:   72 Text Interpretation:  Sinus tachycardia Otherwise normal ECG Confirmed by Ayden Apodaca MD, Cheo Selvey [phone]) on 11/28/2016 8:58:26 PM       Radiology Dg Chest 2 View  Result Date: 11/28/2016 CLINICAL DATA:  Chest pain nausea and lightheadedness since last night. EXAM: CHEST  2 VIEW COMPARISON:  11/29/2013 FINDINGS: The heart size and mediastinal contours are within normal limits. Both lungs are clear. The visualized skeletal structures are unremarkable. IMPRESSION: No active cardiopulmonary disease. Electronically Signed   By: Ellery Plunkaniel R Mitchell M.D.   On: 11/28/2016 20:53    Procedures Procedures (including critical care time)  Medications Ordered in ED Medications  ondansetron (ZOFRAN-ODT) disintegrating tablet 4  mg (4 mg Oral Given 11/28/16 2230)     Initial Impression / Assessment and Plan / ED Course  I have reviewed the triage vital signs and the nursing notes.  Pertinent labs & imaging results that were available during my care of the patient were reviewed by me and considered in my medical decision making (see chart for details).  Clinical Course    Patient presents with chest pain shortness of breath. Patient low risk cardiac and low risk blood clot. Plan for delta troponin d-dimer. Results and differential diagnosis were discussed with the patient/parent/guardian. Xrays were independently reviewed by myself.  Close follow up outpatient was discussed, comfortable with the plan.   Medications  ondansetron (ZOFRAN-ODT) disintegrating tablet 4 mg (4 mg Oral Given 11/28/16 2230)    Vitals:   11/28/16 1954 11/28/16 2125 11/28/16 2130 11/28/16 2315  BP:  126/90 121/88 120/88  Pulse:  92 89 94  Resp:  16 15 20   Temp:      TempSrc:      SpO2:  97% 98% 97%  Weight: 185 lb (83.9 kg)     Height: 5'  3" (1.6 m)       Final diagnoses:  Nonspecific chest pain     Final Clinical Impressions(s) / ED Diagnoses   Final diagnoses:  Nonspecific chest pain    New Prescriptions New Prescriptions   No medications on file     Blane Ohara, MD 11/28/16 2348

## 2017-01-05 ENCOUNTER — Ambulatory Visit: Payer: BLUE CROSS/BLUE SHIELD | Admitting: Internal Medicine

## 2017-01-05 ENCOUNTER — Telehealth: Payer: Self-pay

## 2017-01-05 NOTE — Telephone Encounter (Signed)
Called and LVM for patient regarding appointment. According to Dr.Gherghe we were to see her in 6 months, patient was just seen 11/12/2016 and needs to cancel the appointment today unless she is needing to be seen again. Gave call back number to return phone call.

## 2017-02-04 ENCOUNTER — Telehealth: Payer: Self-pay

## 2017-02-04 NOTE — Telephone Encounter (Signed)
FAXED NOTES TO [facility] 

## 2017-02-09 ENCOUNTER — Telehealth: Payer: Self-pay | Admitting: Cardiovascular Disease

## 2017-02-09 NOTE — Telephone Encounter (Signed)
Received records from [facility] for appointment on 02/12/17 with Dr Allyson SabalBerry.  Records put with Dr Hazle CocaBerry's schedule for 02/12/17. lp

## 2017-02-12 ENCOUNTER — Encounter: Payer: Self-pay | Admitting: Cardiovascular Disease

## 2017-02-12 ENCOUNTER — Ambulatory Visit (INDEPENDENT_AMBULATORY_CARE_PROVIDER_SITE_OTHER): Payer: BLUE CROSS/BLUE SHIELD | Admitting: Cardiovascular Disease

## 2017-02-12 VITALS — BP 102/78 | HR 96 | Ht 63.0 in | Wt 192.2 lb

## 2017-02-12 DIAGNOSIS — R0789 Other chest pain: Secondary | ICD-10-CM | POA: Diagnosis not present

## 2017-02-12 DIAGNOSIS — R002 Palpitations: Secondary | ICD-10-CM | POA: Diagnosis not present

## 2017-02-12 DIAGNOSIS — R55 Syncope and collapse: Secondary | ICD-10-CM

## 2017-02-12 NOTE — Patient Instructions (Signed)
Medication Instructions: Your physician recommends that you continue on your current medications as directed. Please refer to the Current Medication list given to you today.  Testing/Procedures: Your physician has requested that you have an exercise stress myoview. For further information please visit [email or web address]. Please follow instruction sheet, as given.  Your physician has requested that you have an echocardiogram. Echocardiography is a painless test that uses sound waves to create images of your heart. It provides your doctor with information about the size and shape of your heart and how well your heart's chambers and valves are working. This procedure takes approximately one hour. There are no restrictions for this procedure.  Your physician has recommended that you wear a 30 day event monitor. Event monitors are medical devices that record the heart's electrical activity. Doctors most often us these monitors to diagnose arrhythmias. Arrhythmias are problems with the speed or rhythm of the heartbeat. The monitor is a small, portable device. You can wear one while you do your normal daily activities. This is usually used to diagnose what is causing palpitations/syncope (passing out). To [address]  Follow-Up: Your physician recommends that you schedule a follow-up appointment with Dr. Allyson SabalBerry after testing is completed.  If you need a refill on your cardiac medications before your next appointment, please call your pharmacy.

## 2017-02-12 NOTE — Assessment & Plan Note (Signed)
Kristi Davies was referred for atypical chest pain. This began back in October 2017 and now occurs daily basis. Last several minutes that time with radiation to her jaw and upper extremity. I'm going to get an exercise Myoview stress test positivity echocardiogram to rule out an ischemic etiology.

## 2017-02-12 NOTE — Progress Notes (Signed)
02/12/2017 Leanna Battlesarrie Routzahn   09/23/1976  [id number]  Primary Physician Default, Provider, MD Primary Cardiologist: Runell GessJonathan J Nikkia Devoss MD Roseanne RenoFACP, FACC, FAHA, FSCAI  HPI:  Kristi SonCarrie is a delightful 41 year old mild to moderately overweight married Caucasian female mother of one child who currently is a Consulting civil engineerstudent at [employer]. She has no cardiac risk factors. She does have type II bipolar disorder which is pharmacologic B under control. There is no family history of heart disease. She had onset of chest pain beginning back in October of last year now occurring daily lasting 45 minutes that time with radiation to her left neck and upper extremity. She also had episodes of palpitations and dizziness and syncope. Thyroid function studies were normal. She also has PCOS on metformin.     Current Outpatient Prescriptions  Medication Sig Dispense Refill  . albuterol (PROVENTIL HFA;VENTOLIN HFA) 108 (90 Base) MCG/ACT inhaler Inhale 1-2 puffs into the lungs every 6 (six) hours as needed for wheezing or shortness of breath.    . clonazePAM (KLONOPIN) 0.5 MG tablet TAKE 1 TABLET BY MOUTH 2 TIMES DAILY AS NEEDED FOR ANXIETY  0  . cyclobenzaprine (FLEXERIL) 10 MG tablet Take 10 mg by mouth 3 (three) times daily as needed for muscle spasms.    Marland Kitchen. FLOVENT HFA 44 MCG/ACT inhaler Inhale 2 puffs into the lungs 2 (two) times daily.  5  . lamoTRIgine (LAMICTAL) 150 MG tablet Take 150 mg by mouth daily.    . metFORMIN (GLUCOPHAGE) 1000 MG tablet Take 1 tablet (1,000 mg total) by mouth 2 (two) times daily with a meal. 180 tablet 3  . montelukast (SINGULAIR) 10 MG tablet Take 10 mg by mouth at bedtime.    . nitrofurantoin (MACRODANTIN) 100 MG capsule Take 100 mg by mouth at bedtime.  11  . QUEtiapine (SEROQUEL) 100 MG tablet Take 100 mg by mouth at bedtime.    Marland Kitchen. spironolactone (ALDACTONE) 50 MG tablet TAKE 1 TABLET (50 MG TOTAL) BY MOUTH 2x DAILY. (Patient taking differently: Take 50 mg by mouth 2 (two) times  daily. TAKE 1 TABLET (50 MG TOTAL) BY MOUTH 2x DAILY.) 180 tablet 3   No current facility-administered medications for this visit.     Allergies  Allergen Reactions  . Shrimp [Shellfish Allergy] Anaphylaxis  . Latex Itching  . Levaquin [Levofloxacin In D5w] Hives  . Sulfa Antibiotics Rash    Social History   Social History  . Marital status: Single    Spouse name: N/A  . Number of children: N/A  . Years of education: N/A   Occupational History  . Not on file.   Social History Main Topics  . Smoking status: Never Smoker  . Smokeless tobacco: Never Used  . Alcohol use 0.0 oz/week  . Drug use: No  . Sexual activity: Yes    Birth control/ protection: Other-see comments     Comment: vasectomy   Other Topics Concern  . Not on file   Social History Narrative   Single   Self-employed   1 daughter   Exercise: yes   Caffeine use: yes        Review of Systems: General: negative for chills, fever, night sweats or weight changes.  Cardiovascular: negative for chest pain, dyspnea on exertion, edema, orthopnea, palpitations, paroxysmal nocturnal dyspnea or shortness of breath Dermatological: negative for rash Respiratory: negative for cough or wheezing Urologic: negative for hematuria Abdominal: negative for nausea, vomiting, diarrhea, bright red blood per rectum, melena, or hematemesis  Neurologic: negative for visual changes, syncope, or dizziness All other systems reviewed and are otherwise negative except as noted above.    Blood pressure 102/78, pulse 96, height 5\' 3"  (1.6 m), weight 192 lb 3.2 oz (87.2 kg).  General appearance: alert and no distress Neck: no adenopathy, no carotid bruit, no JVD, supple, symmetrical, trachea midline and thyroid not enlarged, symmetric, no tenderness/mass/nodules Lungs: clear to auscultation bilaterally Heart: regular rate and rhythm, S1, S2 normal, no murmur, click, rub or gallop Extremities: extremities normal, atraumatic, no  cyanosis or edema  EKG sinus rhythm at 96 without ST or T-wave changes. I personally reviewed this EKG.  ASSESSMENT AND PLAN:   Atypical chest pain Lira was referred for atypical chest pain. This began back in October 2017 and now occurs daily basis. Last several minutes that time with radiation to her jaw and upper extremity. I'm going to get an exercise Myoview stress test positivity echocardiogram to rule out an ischemic etiology.  Palpitations Aurther Loft also describes symptoms of palpitations which occurred coincident with the onset of her chest pain. Her palpitations are random. She has had episodes of loss of consciousness as well. We will get a 30 day event monitor to further evaluate.      Runell Gess MD FACP,FACC,FAHA, [facility] 02/12/2017 11:31 AM

## 2017-02-12 NOTE — Assessment & Plan Note (Signed)
Kristi Davies also describes symptoms of palpitations which occurred coincident with the onset of her chest pain. Her palpitations are random. She has had episodes of loss of consciousness as well. We will get a 30 day event monitor to further evaluate.

## 2017-02-17 ENCOUNTER — Telehealth (HOSPITAL_COMMUNITY): Payer: Self-pay

## 2017-02-17 NOTE — Telephone Encounter (Signed)
Encounter complete. 

## 2017-02-18 ENCOUNTER — Other Ambulatory Visit: Payer: Self-pay | Admitting: Cardiovascular Disease

## 2017-02-18 DIAGNOSIS — R002 Palpitations: Secondary | ICD-10-CM

## 2017-02-18 DIAGNOSIS — R0789 Other chest pain: Secondary | ICD-10-CM

## 2017-02-18 DIAGNOSIS — R55 Syncope and collapse: Secondary | ICD-10-CM

## 2017-02-19 ENCOUNTER — Inpatient Hospital Stay (HOSPITAL_COMMUNITY)
Admission: RE | Admit: 2017-02-19 | Payer: BLUE CROSS/BLUE SHIELD | Source: Ambulatory Visit | Attending: Cardiovascular Disease | Admitting: Cardiovascular Disease

## 2017-02-22 ENCOUNTER — Telehealth: Payer: Self-pay | Admitting: Cardiovascular Disease

## 2017-02-22 ENCOUNTER — Ambulatory Visit (INDEPENDENT_AMBULATORY_CARE_PROVIDER_SITE_OTHER): Payer: BLUE CROSS/BLUE SHIELD

## 2017-02-22 DIAGNOSIS — R55 Syncope and collapse: Secondary | ICD-10-CM | POA: Diagnosis not present

## 2017-02-22 DIAGNOSIS — R0789 Other chest pain: Secondary | ICD-10-CM | POA: Diagnosis not present

## 2017-02-22 DIAGNOSIS — R002 Palpitations: Secondary | ICD-10-CM

## 2017-02-22 NOTE — Telephone Encounter (Signed)
Insurance will not cover Myoview stress test. I spoke with Dr. Allyson SabalBerry to inform him of this and he recommended a Coronary CT. Pt came in for her appointment Friday morning which had been cancelled. I called pt today and left message on cell phone vm--ok per DPR--to let her know this information. Apologized for the confusion and told pt to call back with any questions or concerns. Also informed pt that someone will call her to schedule this test and billing is working to see if this will be covered by her insurance.

## 2017-02-26 ENCOUNTER — Other Ambulatory Visit: Payer: Self-pay

## 2017-02-26 ENCOUNTER — Ambulatory Visit (HOSPITAL_COMMUNITY): Payer: BLUE CROSS/BLUE SHIELD | Attending: Cardiology

## 2017-02-26 DIAGNOSIS — R002 Palpitations: Secondary | ICD-10-CM

## 2017-02-26 DIAGNOSIS — R55 Syncope and collapse: Secondary | ICD-10-CM

## 2017-02-26 DIAGNOSIS — R0789 Other chest pain: Secondary | ICD-10-CM

## 2017-02-26 LAB — ECHOCARDIOGRAM COMPLETE
Ao-asc: 35 cm
CHL CUP DOP CALC LVOT VTI: 20.9 cm
CHL CUP MV DEC (S): 218
E/e' ratio: 5.32
EWDT: 218 ms
FS: 35 % (ref 28–44)
IVS/LV PW RATIO, ED: 1.2
LA diam index: 1.74 cm/m2
LA vol index: 15.5 mL/m2
LA vol: 29.4 mL
LASIZE: 33 mm
LAVOLA4C: 23 mL
LEFT ATRIUM END SYS DIAM: 33 mm
LV PW d: 6.14 mm — AB (ref 0.6–1.1)
LVEEAVG: 5.32
LVEEMED: 5.32
LVELAT: 14.4 cm/s
LVOT area: 3.14 cm2
LVOTD: 20 mm
LVOTPV: 93.8 cm/s
LVOTSV: 66 mL
MV Peak grad: 2 mmHg
MV pk A vel: 54.3 m/s
MVPKEVEL: 76.6 m/s
RV LATERAL S' VELOCITY: 11.7 cm/s
TAPSE: 18.7 mm
TDI e' lateral: 14.4
TDI e' medial: 8.59

## 2017-03-05 ENCOUNTER — Encounter: Payer: Self-pay | Admitting: Cardiovascular Disease

## 2017-03-12 ENCOUNTER — Encounter (HOSPITAL_COMMUNITY): Payer: Self-pay

## 2017-03-12 ENCOUNTER — Ambulatory Visit (HOSPITAL_COMMUNITY)
Admission: RE | Admit: 2017-03-12 | Discharge: 2017-03-12 | Disposition: A | Discharge status: Home / Self Care | Payer: BLUE CROSS/BLUE SHIELD | Source: Ambulatory Visit | Attending: Cardiovascular Disease | Admitting: Cardiovascular Disease

## 2017-03-12 DIAGNOSIS — K760 Fatty (change of) liver, not elsewhere classified: Secondary | ICD-10-CM | POA: Insufficient documentation

## 2017-03-12 DIAGNOSIS — R55 Syncope and collapse: Secondary | ICD-10-CM | POA: Diagnosis present

## 2017-03-12 DIAGNOSIS — R911 Solitary pulmonary nodule: Secondary | ICD-10-CM | POA: Diagnosis not present

## 2017-03-12 DIAGNOSIS — R002 Palpitations: Secondary | ICD-10-CM | POA: Diagnosis present

## 2017-03-12 DIAGNOSIS — R079 Chest pain, unspecified: Secondary | ICD-10-CM | POA: Diagnosis not present

## 2017-03-12 DIAGNOSIS — R0789 Other chest pain: Secondary | ICD-10-CM

## 2017-03-12 IMAGING — CT CT HEART MORP W/ CTA COR W/ SCORE W/ CA W/CM &/OR W/O CM
1 of 10 series · 1 of 20 positions shown, 2 images · IV contrast (Iodine)
Comparison: 11/28/2016 chest radiograph.

EXAM:
OVER-READ INTERPRETATION  CT CHEST

The following report is an over-read performed by radiologist Dr.
does not include interpretation of cardiac or coronary anatomy or
pathology. The coronary CTA interpretation by the cardiologist is
attached.
CLINICAL DATA: Chest pain
Cardiac CTA
MEDICATIONS:
Sub lingual nitro. 4mg and lopressor 10mg
TECHNIQUE: The patient was scanned on a [employer] [REDACTED]ice scanner. Gantry
rotation speed was 270 msecs. Collimation was .9mm. A 100 kV
prospective scan was triggered in the descending thoracic aorta at
111 HU's with 5% padding centered around 78% of the R-R interval.
Average HR during the scan was 64 bpm. The 3D data set was
interpreted on a dedicated work station using MPR, MIP and VRT
modes. A total of 80cc of contrast was used.

[Series 300: locator · axial · 0.35mm/px · z∈[+825,+825]mm · 1 of 1 slices shown, 2 images]
[im 1/1  vessel]
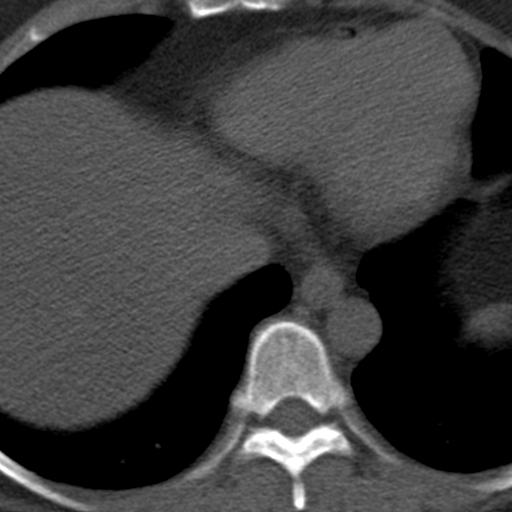
[im 1/1  lung]
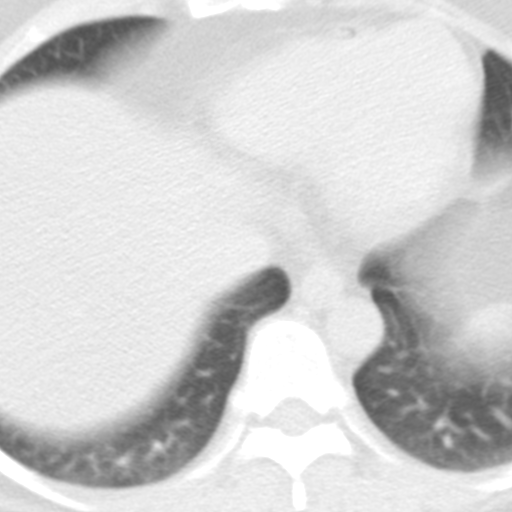

[1 of 20 positions shown; findings below may reference images not displayed]

FINDINGS: Cardiovascular: Great vessels are normal in course and caliber. No
central pulmonary emboli.

Mediastinum/Nodes: Unremarkable esophagus. No pathologically
enlarged axillary, mediastinal or hilar lymph nodes.

Lungs/Pleura: No pneumothorax. No pleural effusion. Right middle
lobe 3 mm solid pulmonary nodule (series 204/ image 24).
Ground-glass 5 mm left upper lobe pulmonary nodule (series 204/image
13). No acute consolidative airspace disease, lung masses or
additional significant pulmonary nodules.

Upper abdomen: Diffuse hepatic steatosis. Simple 1.0 cm lateral
segment left liver lobe cyst.

Musculoskeletal:  No aggressive appearing focal osseous lesions.
IMPRESSION: 1. Right middle lobe 3 mm solid pulmonary nodule. No follow-up
needed if patient is low-risk. Non-contrast chest CT can be
considered in 12 months if patient is high-risk. This recommendation
follows the consensus statement: Guidelines for Management of
Incidental Pulmonary Nodules Detected on CT Images: From the
2. Left upper lobe ground-glass 5 mm pulmonary nodule. No follow-up
recommended. This recommendation follows the consensus statement:
Guidelines for Management of Incidental Pulmonary Nodules Detected
[DATE].
3. Diffuse hepatic steatosis.
FINDINGS: Non-cardiac: See separate report from [REDACTED]. No
significant findings on limited lung and soft tissue windows.

Calcium Score: 0

Coronary Arteries: Right dominant with no anomalies

LM: Normal

LAD:  Normal

D1: Normal

D2: Normal

Circumflex: Normal

OM1:  Small and normal

OM2:  Normal

RCA:  Dominant and normal

PDA:  Normla

PLA:  Normal
IMPRESSION: 1) Normal right dominant coronary arteries

2) Calcium score 0

3) Normal aortic root 29 mm

Takeshi Rautela

## 2017-03-12 MED ORDER — IOPAMIDOL (ISOVUE-370) INJECTION 76%
INTRAVENOUS | Status: AC
  Administered 2017-03-12: 100 mL
  Filled 2017-03-12: qty 100

## 2017-03-12 MED ORDER — METOPROLOL TARTRATE 5 MG/5ML IV SOLN
INTRAVENOUS | Status: AC
  Administered 2017-03-12 (×2)
  Administered 2017-03-12: 5 mg
  Filled 2017-03-12: qty 15

## 2017-03-12 MED ORDER — NITROGLYCERIN 0.4 MG SL SUBL
SUBLINGUAL_TABLET | SUBLINGUAL | Status: AC
  Administered 2017-03-12: 0.8 mg
  Filled 2017-03-12: qty 2

## 2017-03-26 ENCOUNTER — Ambulatory Visit: Payer: BLUE CROSS/BLUE SHIELD | Admitting: Cardiovascular Disease

## 2017-04-06 ENCOUNTER — Ambulatory Visit (INDEPENDENT_AMBULATORY_CARE_PROVIDER_SITE_OTHER): Payer: BLUE CROSS/BLUE SHIELD | Admitting: Cardiovascular Disease

## 2017-04-06 ENCOUNTER — Encounter: Payer: Self-pay | Admitting: Cardiovascular Disease

## 2017-04-06 VITALS — BP 122/89 | HR 93 | Ht 63.0 in | Wt 189.8 lb

## 2017-04-06 DIAGNOSIS — R0789 Other chest pain: Secondary | ICD-10-CM

## 2017-04-06 MED ORDER — PANTOPRAZOLE SODIUM 40 MG PO TBEC
40.0000 mg | DELAYED_RELEASE_TABLET | Freq: Every day | ORAL | 11 refills | Status: DC
Start: 2017-04-06 — End: 2018-03-29

## 2017-04-06 NOTE — Progress Notes (Signed)
Arra returns today for follow-up of her outpatient test. 2-D echo was entirely normal and her monitor showed only sinus rhythm. Her Myoview was turned down because she was low risk. I am going to order a routine GXT. I'm also put her on empiric antireflux medications and we'll see her back in 6 months.

## 2017-04-06 NOTE — Patient Instructions (Signed)
Medication Instructions:   START protonix (pantoprazole)  DAILY  Labwork:   None ordered  Testing/Procedures:  GXT (exercise stress test)  Your physician has requested that you have an exercise tolerance test. For further information please visit [email or web address] Please also follow instruction sheet, as given.   Follow-Up:  6 months with Dr. Allyson Sabal    If you need a refill on your cardiac medications before your next appointment, please call your pharmacy.

## 2017-04-06 NOTE — Assessment & Plan Note (Signed)
Margareth continues to have atypical chest pain. She has no risk factors. Her 2-D echo was entirely normal. I'm going to get a routine GXT to further evaluate

## 2017-04-06 NOTE — Assessment & Plan Note (Signed)
History of palpitations with recent event monitor that showed only sinus rhythm.

## 2017-04-14 ENCOUNTER — Telehealth (HOSPITAL_COMMUNITY): Payer: Self-pay

## 2017-04-14 NOTE — Telephone Encounter (Signed)
Encounter complete. 

## 2017-04-16 ENCOUNTER — Ambulatory Visit (HOSPITAL_COMMUNITY)
Admission: RE | Admit: 2017-04-16 | Discharge: 2017-04-16 | Disposition: A | Discharge status: Home / Self Care | Payer: BLUE CROSS/BLUE SHIELD | Source: Ambulatory Visit | Attending: Cardiovascular Disease | Admitting: Cardiovascular Disease

## 2017-04-16 DIAGNOSIS — R0789 Other chest pain: Secondary | ICD-10-CM | POA: Diagnosis not present

## 2017-04-18 LAB — EXERCISE TOLERANCE TEST
CHL CUP MPHR: 181 {beats}/min
CHL CUP RESTING HR STRESS: 83 {beats}/min
CSEPEDS: 26 s
Estimated workload: 7.6 METS
Exercise duration (min): 6 min
Peak HR: 169 {beats}/min
Percent HR: 93 %
RPE: 18

## 2017-05-13 ENCOUNTER — Ambulatory Visit (INDEPENDENT_AMBULATORY_CARE_PROVIDER_SITE_OTHER): Payer: BLUE CROSS/BLUE SHIELD | Admitting: Internal Medicine

## 2017-05-13 VITALS — BP 118/82 | HR 101 | Wt 194.0 lb

## 2017-05-13 DIAGNOSIS — E282 Polycystic ovarian syndrome: Secondary | ICD-10-CM

## 2017-05-13 DIAGNOSIS — R5383 Other fatigue: Secondary | ICD-10-CM | POA: Diagnosis not present

## 2017-05-13 LAB — HEMOGLOBIN A1C: Hgb A1c MFr Bld: 5.7 % (ref 4.6–6.5)

## 2017-05-13 NOTE — Progress Notes (Signed)
Patient ID: Kristi Davies, female   DOB: 12-28-1975, 41 y.o.   MRN: [id number]  HPI: Kristi Davies [pronounced:"shots"] is a 41 y.o. female, returning for f/u for PCOS. Last visit 6 mo ago.  She had a recent stress test >> normal.  No steroids recently.  She continues to feel fatigued. She did not try to reduce spironolactone, as discussed at last visit.  Reviewed and addended hx: Pt was dx with PCOS several years ago: hirsutism, acne, weight gain. She was started on OCP in 2012. Hirsutism and acne greatly improved on the OCPs. After she stopped OCPs (after salpingectomy 11/2015) >> noticed a little bit more acne and scalp hair loss.  We started Spironolactone 50 mg daily at the beginning of 2017, then increased to twice a day.  She had a great weight response to Metformin >> lost 20 lbs in 1 mo after she started and more weight loss after increasing the dose to 1000 mg daily. However, she stabilized in summer 2017 despite increased exercise and eating better.  She is now on: - Metformin ER 1000 mg 2x daily - She stopped Yaz after her bilateral tubal ligation   - spironolactone 50 mg twice a day - Rogaine foam - started 11/2017 - scalp concealer - did not try Vaniqua (sensitive skin)   Weight gain: - over the years - + steroid use - Prednisone in the past >> moody, fluid retention - has to use steroids 1x a year (asthma) - no weight loss meds - exercise: Martial arts  Fertility/Menstrual cycles: - regular menses initially (but heavy), then stopped after endom. ablation (2014) after she bled for 1 month - No history of ovarian cysts - children: 1, gave birth at 54 years old  - miscarriages: no - contraception: Yaz >> stopped after salpingectomy 11/2015  Acne: - better: cystic, now only scarring - chin, shoulders  Hirsutism: - Sideburns and upper lip  - she has hair loss from vertex >> Using Rogaine foam    - Last thyroid tests: Lab Results  Component Value Date   TSH  1.02 11/12/2016   FREET4 1.01 11/12/2016   - Last HbA1c: Lab Results  Component Value Date   HGBA1C 5.5 07/06/2016   Family history of diabetes in both parents.  ROS: Constitutional: no weight gain/no weight loss, + fatigue, no subjective hyperthermia, + subjective hypothermia Eyes: no blurry vision, no xerophthalmia ENT: no sore throat, no nodules palpated in throat, no dysphagia, no odynophagia, no hoarseness Cardiovascular: no CP/no SOB/no palpitations/no leg swelling Respiratory: no cough/no SOB/no wheezing Gastrointestinal: no N/no V/+ D/+ C/no acid reflux Musculoskeletal: + muscle aches/no joint aches Skin: + acne, + hirsutism, + hair loss Neurological: no tremors/no numbness/no tingling/no dizziness  I reviewed pt's medications, allergies, PMH, social hx, family hx, and changes were documented in the history of present illness. Otherwise, unchanged from my initial visit note.   Past Medical History:  Diagnosis Date  . Allergy   . Asthma   . Bipolar disorder (HCC)    Type 2  . Depression   . Headache    Migraines  . IBS (irritable bowel syndrome)   . Insomnia   . PCOS (polycystic ovarian syndrome)   . Pneumonia    history of  . Vitamin D deficiency    Past Surgical History:  Procedure Laterality Date  . BLADDER SUSPENSION N/A 08/30/2013   Procedure: TRANSVAGINAL TAPE (TVT) PROCEDURE;  Surgeon: Lavina Hamman, MD;  Location: [facility];  Service: Gynecology;  Laterality: N/A;  .  EYE SURGERY    . HYSTEROSCOPY WITH NOVASURE N/A 08/30/2013   Procedure: HYSTEROSCOPY WITH NOVASURE;  Surgeon: Lavina Hammanodd Meisinger, MD;  Location: [facility];  Service: Gynecology;  Laterality: N/A;  1 1/2 hrs OR time  . LAPAROSCOPIC TUBAL LIGATION Bilateral 11/20/2015   Procedure: LAPAROSCOPIC TUBAL LIGATION;  Surgeon: Lavina Hammanodd Meisinger, MD;  Location: [facility];  Service: Gynecology;  Laterality: Bilateral;   Social History   Social History  . Marital Status: Single    Spouse Name: N/A  . Number of  Children: 1    Social History Main Topics  . Smoking status: Never Smoker   . Smokeless tobacco: Not on file  . Alcohol Use: 0.0 oz/week    0 Standard drinks or equivalent per week  . Drug Use: No  . Sexual Activity: Yes    Birth Control/ Protection: Other-see comments     Comment: vasectomy   Social History Narrative   Single   Self-employed   1 daughter   Exercise: yes   Caffeine use: yes      Current Outpatient Prescriptions on File Prior to Visit  Medication Sig Dispense Refill  . albuterol (PROVENTIL HFA;VENTOLIN HFA) 108 (90 Base) MCG/ACT inhaler Inhale 1-2 puffs into the lungs every 6 (six) hours as needed for wheezing or shortness of breath.    . clonazePAM (KLONOPIN) 0.5 MG tablet TAKE 1 TABLET BY MOUTH 2 TIMES DAILY AS NEEDED FOR ANXIETY  0  . cyclobenzaprine (FLEXERIL) 10 MG tablet Take 10 mg by mouth 3 (three) times daily as needed for muscle spasms.    Marland Kitchen. FLOVENT HFA 44 MCG/ACT inhaler Inhale 2 puffs into the lungs 2 (two) times daily.  5  . lamoTRIgine (LAMICTAL) 150 MG tablet Take 150 mg by mouth daily.    . metFORMIN (GLUCOPHAGE) 1000 MG tablet Take 1 tablet (1,000 mg total) by mouth 2 (two) times daily with a meal. 180 tablet 3  . montelukast (SINGULAIR) 10 MG tablet Take 10 mg by mouth at bedtime.    . nitrofurantoin (MACRODANTIN) 100 MG capsule Take 100 mg by mouth at bedtime.  11  . pantoprazole (PROTONIX) 40 MG tablet Take 1 tablet (40 mg total) by mouth daily. 30 tablet 11  . QUEtiapine (SEROQUEL) 100 MG tablet Take 100 mg by mouth at bedtime.    Marland Kitchen. spironolactone (ALDACTONE) 50 MG tablet TAKE 1 TABLET (50 MG TOTAL) BY MOUTH 2x DAILY. (Patient taking differently: Take 50 mg by mouth 2 (two) times daily. TAKE 1 TABLET (50 MG TOTAL) BY MOUTH 2x DAILY.) 180 tablet 3   No current facility-administered medications on file prior to visit.    Allergies  Allergen Reactions  . Shrimp [Shellfish Allergy] Anaphylaxis  . Latex Itching  . Levaquin [Levofloxacin In D5w]  Hives  . Sulfa Antibiotics Rash   Family History  Problem Relation Age of Onset  . Depression Mother   . Diabetes Mother   . Diabetes Father   . Heart disease Father   . Dementia Maternal Grandmother   . Heart disease Maternal Grandfather   . Dementia Paternal Grandmother   . Arthritis Paternal Grandfather    PE: BP 118/82 (BP Location: Left Arm, Patient Position: Sitting)   Pulse (!) 101   Wt 194 lb (88 kg)   SpO2 97%   BMI 34.37 kg/m  Wt Readings from Last 3 Encounters:  05/13/17 194 lb (88 kg)  04/06/17 189 lb 12.8 oz (86.1 kg)  02/12/17 192 lb 3.2 oz (87.2 kg)   Constitutional:  overweight, in NAD Eyes: PERRLA, EOMI, no exophthalmos ENT: moist mucous membranes, no thyromegaly, no cervical lymphadenopathy Cardiovascular: tachycardia, RR, No MRG Respiratory: CTA B Gastrointestinal: abdomen soft, NT, ND, BS+ Musculoskeletal: no deformities, strength intact in all 4 Skin: moist, warm, + acne on face,  no dark terminal hair on chin,  no vellum on sideburns, no skin tags, very faint acanthosis nigricans, no purple, wide, stretch marks; + hair thinning vertex  Neurological: no tremor with outstretched hands, DTR normal in all 4  ASSESSMENT: 1. PCOS  2. Fatigue  PLAN: 1.  - Patient with acne, hirsutism, hair loss, now off oral contraceptives for 1.5 years. We started spironolactone 50 mg daily and she has been taking it twice a day now for almost a year. No problems with hypotension or potassium abnormalities.  We did discuss in the past to try to decrease the dose to see if this helps with her fatigue. She did not try this.I advised her to give it a try for about the week and if her fatigue is the same, to increase the dose back to 50 mg twice a day. - She is continuing to use Rogaine and scalp concealer for her hair loss -  We decided to check a testosterone level (despite the fact that she is on spironolactone and high levels are not necessarily clinically significant as  spironolactone blocks the testosterone receptors)  And we discussed about the possibility to restart an oral contraceptive to see if this else with a hair loss and the hirsutism. She agrees with this.  -  Last hemoglobin A1c Was excellent at 5.5%. No signs of diabetes or prediabetes  -  she continues on metformin ER, to which she had a good response initially in terms of weight loss, however, she didn't gain weight since last visit, net 5 pounds. -  we'll continue metformin, spironolactone and check another HbA1c and a testosterone level today. -  I will see her back in 6 months  2. Fatigue - at last visit, we checked her for thyroid disease as she has a history of this in her mom, we also check a vitamin B12 and D, potassium, CBC. All labs were normal except for a higher WBC count >>  I advised her to discuss this with her PCP. She had a repeat set of labs with PCP and white count was reportedly normal. - We discussed about the possibility of a chronic infectious disease  (she was wondering whether she may have mono) >>  she will check back with PCP.  If testosterone high >> may need a Yaz or another OCP >> need one with low risk of clots.  Component     Latest Ref Rng & Units 05/13/2017  Testosterone     8 - 48 ng/dL 7 (L)  Testosterone Free     0.0 - 4.2 pg/mL 1.0  Sex Horm Binding Glob, Serum     24.6 - 122.0 nmol/L 44.3  Hemoglobin A1C     4.6 - 6.5 % 5.7   Testosterone is not high. At this point, I would leave it at her latitude if she wants to restart OCP HbA1c is only slightly higher, borderline with prediabetes.  Carlus Pavlov, MD PhD [facility] Endocrinology

## 2017-05-13 NOTE — Patient Instructions (Signed)
Try to reduce the Spironolactone dose to 1/2 and see how you feel.  Please stop at the lab.  Continue Metformin ER 1000 mg 2x a day.  Please come back for a follow-up appointment in 6 months.

## 2017-05-14 ENCOUNTER — Encounter: Payer: Self-pay | Admitting: Internal Medicine

## 2017-05-14 LAB — TESTOSTERONE, FREE, TOTAL, SHBG
SEX HORMONE BINDING: 44.3 nmol/L (ref 24.6–122.0)
TESTOSTERONE FREE: 1 pg/mL (ref 0.0–4.2)
TESTOSTERONE: 7 ng/dL — AB (ref 8–48)

## 2017-07-23 ENCOUNTER — Other Ambulatory Visit: Payer: Self-pay | Admitting: Internal Medicine

## 2017-11-12 ENCOUNTER — Encounter: Payer: Self-pay | Admitting: Internal Medicine

## 2017-11-12 ENCOUNTER — Ambulatory Visit: Payer: 59 | Admitting: Internal Medicine

## 2017-11-12 VITALS — BP 104/60 | HR 96 | Ht 65.0 in | Wt 193.8 lb

## 2017-11-12 DIAGNOSIS — R5383 Other fatigue: Secondary | ICD-10-CM | POA: Diagnosis not present

## 2017-11-12 DIAGNOSIS — E282 Polycystic ovarian syndrome: Secondary | ICD-10-CM | POA: Diagnosis not present

## 2017-11-12 NOTE — Patient Instructions (Signed)
Please continue Metformin 1000 mg 2x a day with meals.  Please return in  6 months.

## 2017-11-12 NOTE — Progress Notes (Signed)
Patient ID: Kristi Davies, female   DOB: [birth year], 41 y.o.   MRN: [id number]  HPI: Kristi Davies [pronounced:"shots"] is a 41 y.o. female, returning for f/u for PCOS. Last visit 6 months ago.  At last visit, she felt very fatigued and she considered reducing the spironolactone dose to see if she felt better.  She did not, so she restarted  Spironolactone. Se actually stopped this 3-4 mo ago >> a little more acne.  She changed asthma med >> now Symbicort.   Reviewed and addended history: Pt was dx with PCOS several years ago: hirsutism, acne, weight gain. She was started on OCP in 2012. Hirsutism and acne greatly improved on the OCPs. After she stopped OCPs (after salpingectomy 11/2015) >> noticed a little bit more acne and scalp hair loss.  We started Spironolactone 50 mg daily at the beginning of 2017, then increased to 2X a day.  She had a great weight response to Metformin >> lost 20 lbs in 1 mo after she started and more weight loss after increasing the dose to 1000 mg daily. However, she stabilized in summer 2017 despite increased exercise and eating better.  At last visit, we checked her testosterone (despite being on Spironolactone) and this was actually low. At that time, I left it at her latitude whether she wanted to start OCPs (with low VTE potential).  She decided against starting this.  Component     Latest Ref Rng & Units 05/13/2017  Testosterone     8 - 48 ng/dL 7 (L)  Testosterone Free     0.0 - 4.2 pg/mL 1.0  Sex Horm Binding Glob, Serum     24.6 - 122.0 nmol/L 44.3   She is now on: -Metformin ER 1000 mg twice a day -Spironolactone 50 mg twice a day - stopped 3-4 mo ago -Rogaine foam started 11/2016 -Scalp concealer -Did not try Vaniqua (sensitive skin) -She stopped Yaz after her bilateral tubal ligation  She bleeds once a month - 1 day 2/2 endom. ablation.  Weight gain: - over the years - + steroid use - Prednisone in the past >> moody, fluid retention - has to  use steroids 1x a year (asthma) - no weight loss meds - exercise: Martial arts  Fertility/Menstrual cycles: - regular menses initially (but heavy), then stopped after endom. ablation (2014) after she bled for 1 month - No history of ovarian cysts - children: 1, gave birth at 47 years old  - miscarriages: no - contraception: Yaz >> stopped after salpingectomy 11/2015  Acne: - better: cystic, now only scarring - chin, shoulders  Hirsutism: - Sideburns and upper lip  - she has hair loss from vertex >> Using Rogaine foam  - Last thyroid tests were normal: 07/14/2017: TSH 3.48 Lab Results  Component Value Date   TSH 1.02 11/12/2016   FREET4 1.01 11/12/2016   - Last HbA1c  Was in the low prediabetic range: Lab Results  Component Value Date   HGBA1C 5.7 05/13/2017   She has family history of diabetes in both parents.  + HL: 07/14/2017: 208/192/43/127  + Transaminitis: 07/14/2017: AST/ALT: 38/71  07/14/2017: ESR: 21  She has a history of a normal stress test.  ROS: Constitutional: no weight gain/no weight loss, no fatigue, no subjective hyperthermia, + subjective hypothermia Eyes: no blurry vision, no xerophthalmia ENT: no sore throat, no nodules palpated in throat, no dysphagia, no odynophagia, no hoarseness Cardiovascular: no CP/no SOB/no palpitations/no leg swelling Respiratory: no cough/no SOB/no wheezing Gastrointestinal: no N/no  V/no D/no C/no acid reflux Musculoskeletal: no muscle aches/no joint aches Skin: no rashes, no hair loss Neurological: no tremors/no numbness/no tingling/no dizziness  I reviewed pt's medications, allergies, PMH, social hx, family hx, and changes were documented in the history of present illness. Otherwise, unchanged from my initial visit note.   Past Medical History:  Diagnosis Date  . Allergy   . Asthma   . Bipolar disorder ([place])    Type 2  . Depression   . Headache    Migraines  . IBS (irritable bowel syndrome)   . Insomnia    . PCOS (polycystic ovarian syndrome)   . Pneumonia    history of  . Vitamin D deficiency    Past Surgical History:  Procedure Laterality Date  . BLADDER SUSPENSION N/A 08/30/2013   Procedure: TRANSVAGINAL TAPE (TVT) PROCEDURE;  Surgeon: Cheri Fowler, MD;  Location: [facility];  Service: Gynecology;  Laterality: N/A;  . EYE SURGERY    . HYSTEROSCOPY WITH NOVASURE N/A 08/30/2013   Procedure: HYSTEROSCOPY WITH NOVASURE;  Surgeon: Cheri Fowler, MD;  Location: [facility];  Service: Gynecology;  Laterality: N/A;  1 1/2 hrs OR time  . LAPAROSCOPIC TUBAL LIGATION Bilateral 11/20/2015   Procedure: LAPAROSCOPIC TUBAL LIGATION;  Surgeon: Cheri Fowler, MD;  Location: [facility];  Service: Gynecology;  Laterality: Bilateral;   Social History   Social History  . Marital Status: Single    Spouse Name: N/A  . Number of Children: 1    Social History Main Topics  . Smoking status: Never Smoker   . Smokeless tobacco: Not on file  . Alcohol Use: 0.0 oz/week    0 Standard drinks or equivalent per week  . Drug Use: No  . Sexual Activity: Yes    Birth Control/ Protection: Other-see comments     Comment: vasectomy   Social History Narrative   Single   Self-employed   1 daughter   Exercise: yes   Caffeine use: yes      Current Outpatient Medications on File Prior to Visit  Medication Sig Dispense Refill  . albuterol (PROVENTIL HFA;VENTOLIN HFA) 108 (90 Base) MCG/ACT inhaler Inhale 1-2 puffs into the lungs every 6 (six) hours as needed for wheezing or shortness of breath.    . clonazePAM (KLONOPIN) 0.5 MG tablet TAKE 1 TABLET BY MOUTH 2 TIMES DAILY AS NEEDED FOR ANXIETY  0  . cyclobenzaprine (FLEXERIL) 10 MG tablet Take 10 mg by mouth 3 (three) times daily as needed for muscle spasms.    Marland Kitchen FLOVENT HFA 44 MCG/ACT inhaler Inhale 2 puffs into the lungs 2 (two) times daily.  5  . lamoTRIgine (LAMICTAL) 150 MG tablet Take 150 mg by mouth daily.    . metFORMIN (GLUCOPHAGE) 1000 MG tablet TAKE 1 TABLET BY  MOUTH TWICE A DAY WITH MEALS 180 tablet 3  . montelukast (SINGULAIR) 10 MG tablet Take 10 mg by mouth at bedtime.    . nitrofurantoin (MACRODANTIN) 100 MG capsule Take 100 mg by mouth at bedtime.  11  . pantoprazole (PROTONIX) 40 MG tablet Take 1 tablet (40 mg total) by mouth daily. 30 tablet 11  . QUEtiapine (SEROQUEL) 100 MG tablet Take 100 mg by mouth at bedtime.    Marland Kitchen spironolactone (ALDACTONE) 50 MG tablet TAKE 1 TABLET (50 MG TOTAL) BY MOUTH 2x DAILY. (Patient taking differently: Take 50 mg by mouth 2 (two) times daily. TAKE 1 TABLET (50 MG TOTAL) BY MOUTH 2x DAILY.) 180 tablet 3   No current facility-administered medications on  file prior to visit.    Allergies  Allergen Reactions  . Shrimp [Shellfish Allergy] Anaphylaxis  . Latex Itching  . Levaquin [Levofloxacin In D5w] Hives  . Sulfa Antibiotics Rash   Family History  Problem Relation Age of Onset  . Depression Mother   . Diabetes Mother   . Diabetes Father   . Heart disease Father   . Dementia Maternal Grandmother   . Heart disease Maternal Grandfather   . Dementia Paternal Grandmother   . Arthritis Paternal Grandfather    PE: BP 104/60   Pulse 96   Ht '5\' 5"'  (1.651 m)   Wt 193 lb 12.8 oz (87.9 kg)   SpO2 96%   BMI 32.25 kg/m  Wt Readings from Last 3 Encounters:  11/12/17 193 lb 12.8 oz (87.9 kg)  05/13/17 194 lb (88 kg)  04/06/17 189 lb 12.8 oz (86.1 kg)   Constitutional: overweight, in NAD Eyes: PERRLA, EOMI, no exophthalmos ENT: moist mucous membranes, no thyromegaly, no cervical lymphadenopathy Cardiovascular: tachycardia, RR, No MRG Respiratory: CTA B Gastrointestinal: abdomen soft, NT, ND, BS+ Musculoskeletal: no deformities, strength intact in all 4 Skin: moist, warm,+ acne on face,  no dark terminal hair on chin,  no vellum on sideburns, no skin tags, very faint acanthosis nigricans, no purple, wide, stretch marks; + hair thinning on tvertex Neurological: no tremor with outstretched hands, DTR normal  in all 4  ASSESSMENT: 1. PCOS  2. Fatigue  PLAN: 1.  -Patient with acne, hirsutism, hair loss, off oral contraceptives for 2 years  along with spironolactone 50 mg twice a day and metformin 1000 mg twice a day.  She did complain about significant fatigue at last visit and she tried decreasing the dose of spironolactone to see if this would help. She is off this now. Fatigue a little better. Acne a little worse but not signif. - She is also continuing Rogaine-phone and scalp concealer for her weight loss - At last visit, her testosterone level was actually low, so I did not suggest to start oral contraceptives but left it at her latitude.  She does have regular menstrual cycles, although short. - Also at last visit, she had an HbA1c of 5.7%, which is borderline between normal and prediabetes.  She does continue on metformin ER.  She had a very good response to this in terms of weight in the past, however, weight has been stable, high, since last visit -  We will continue metformin, but will stay off spironolactone - Next time will check: HbA1c, TFTs, testosterone -  I will see her back in 6 months  2. Fatigue - In the past, we checked her for thyroid disease, B12 deficiency, vitamin D deficiency, hypokalemia, anemia.  All labs were normal except for a higher white blood cell count.  She had a repeat WBC count with PCP which was normal. - fatigue is a little better, possibly after stopping her Spironolactone + changing inhalers  Philemon Kingdom, MD PhD [facility] Endocrinology

## 2018-03-29 ENCOUNTER — Ambulatory Visit (INDEPENDENT_AMBULATORY_CARE_PROVIDER_SITE_OTHER)
Admission: RE | Admit: 2018-03-29 | Discharge: 2018-03-29 | Disposition: A | Discharge status: Home / Self Care | Payer: 59 | Source: Ambulatory Visit | Attending: Internal Medicine | Admitting: Internal Medicine

## 2018-03-29 ENCOUNTER — Encounter: Payer: Self-pay | Admitting: Internal Medicine

## 2018-03-29 ENCOUNTER — Ambulatory Visit: Payer: 59 | Admitting: Internal Medicine

## 2018-03-29 VITALS — BP 124/80 | HR 85 | Ht 63.0 in | Wt 187.2 lb

## 2018-03-29 DIAGNOSIS — R059 Cough, unspecified: Secondary | ICD-10-CM

## 2018-03-29 DIAGNOSIS — J45991 Cough variant asthma: Secondary | ICD-10-CM

## 2018-03-29 DIAGNOSIS — R05 Cough: Secondary | ICD-10-CM

## 2018-03-29 LAB — NITRIC OXIDE: Nitric Oxide: 11

## 2018-03-29 MED ORDER — BENZONATATE 200 MG PO CAPS: 200.0000 mg | ORAL_CAPSULE | Freq: Three times a day (TID) | ORAL | 2 refills | Status: DC | PRN

## 2018-03-29 MED ORDER — BUDESONIDE-FORMOTEROL FUMARATE 80-4.5 MCG/ACT IN AERO: INHALATION_SPRAY | RESPIRATORY_TRACT | 12 refills | Status: DC

## 2018-03-29 MED ORDER — FAMOTIDINE 20 MG PO TABS: ORAL_TABLET | ORAL | 11 refills | Status: DC

## 2018-03-29 MED ORDER — PANTOPRAZOLE SODIUM 40 MG PO TBEC: 40.0000 mg | DELAYED_RELEASE_TABLET | Freq: Every day | ORAL | 2 refills | Status: DC

## 2018-03-29 NOTE — Assessment & Plan Note (Addendum)
-   [name] 03/29/2018  =   11 on symb 160  - 03/29/2018  After extensive coaching inhaler device  effectiveness =    90% with spacer > try reduce symbicort to 80 2bid - Spirometry 03/29/2018  Nl with no curvature on symb 160  Sinus CT ordered   DDX of  difficult airways management almost all start with A and  include Adherence, Ace Inhibitors, Acid Reflux, Active Sinus Disease, Alpha 1 Antitripsin deficiency, Anxiety masquerading as Airways dz,  ABPA,  Allergy(esp in young), Aspiration (esp in elderly), Adverse effects of meds,  Active smokers, A bunch of PE's (a small clot burden can't cause this syndrome unless there is already severe underlying pulm or vascular dz with poor reserve) plus two Bs  = Bronchiectasis and Beta blocker use..and one C= CHF   Adherence is always the initial "prime suspect" and is a multilayered concern that requires a "trust but verify" approach in every patient - starting with knowing how to use medications, especially inhalers, correctly, keeping up with refills and understanding the fundamental difference between maintenance and prns vs those medications only taken for a very short course and then stopped and not refilled.  - see hfa teaching - rec return with all meds in hand using a trust but verify approach to confirm accurate Medication  Reconciliation The principal here is that until we are certain that the  patients are doing what we've asked, it makes no sense to ask them to do more.    ? Acid (or non-acid) GERD > always difficult to exclude as up to 75% of pts in some series report no assoc GI/ Heartburn symptoms> rec max (24h)  acid suppression and diet restrictions/ reviewed and instructions given in writing.   ? Allergy >  Low feno does not support this as a mech unless assoc with pnds (see below) so continue singulair/start low dose symb and regroup in 4 weeks  ? Active sinus dz > check sinus CT   ? Adverse effects of meds> intol of dpi/ on macrodantin but no  evidence toxicity so far > cautioned re side effects  ? Anxiety > usually at the very bottom of this list of usual suspects but should be included  on this pt's based on H and P and note already on psychotropics and may interfere with adherence and also interpretation of response or lack thereof to symptom management which can be quite subjective.    Total time devoted to counseling  > 50 % of initial 60 min office visit:  review case with pt/ discussion of options/alternatives/ personally creating written customized instructions  in presence of pt  then going over those specific  Instructions directly with the pt including how to use all of the meds but in particular covering each new medication in detail and the difference between the maintenance= "automatic" meds and the prns using an action plan format for the latter (If this problem/symptom => do that organization reading Left to right).  Please see AVS from this visit for a full list of these instructions which I personally wrote for this pt and  are unique to this visit.

## 2018-03-29 NOTE — Patient Instructions (Addendum)
For cough  > tessalon 200 mg every 8 hours as neede d  Try symbicort 80 Take 2 puffs first thing in am and then another 2 puffs about 12 hours later.   Only use your albuterol as a rescue medication to be used if you can't catch your breath by resting or doing a relaxed purse lip breathing pattern.  - The less you use it, the better it will work when you need it. - Ok to use up to 2 puffs  every 4 hours if you must but call for immediate appointment if use goes up over your usual need - Don't leave home without it !!  (think of it like the spare tire for your car)    Pantoprazole (protonix) 40 mg   Take  30-60 min before first meal of the day and Pepcid (famotidine)  20 mg one hour before   bedtime until return to office - this is the best way to tell whether stomach acid is contributing to your problem.    GERD (REFLUX)  is an extremely common cause of respiratory symptoms just like yours , many times with no obvious heartburn at all.    It can be treated with medication, but also with lifestyle changes including elevation of the head of your bed (ideally with 6 inch  bed blocks),  Smoking cessation, avoidance of late meals, excessive alcohol, and avoid fatty foods, chocolate, peppermint, colas, red wine, and acidic juices such as orange juice.  NO MINT OR MENTHOL PRODUCTS SO NO COUGH DROPS   USE SUGARLESS CANDY INSTEAD (Jolley ranchers or Stover's or Life Savers) or even ice chips will also do - the key is to swallow to prevent all throat clearing. NO OIL BASED VITAMINS - use powdered substitutes.  Please see patient coordinator before you leave today  to schedule sinus CT     Please remember to go to the  x-ray department downstairs in the basement  for your tests - we will call you with the results when they are available.     Please schedule a follow up office visit in 4 weeks, sooner if needed

## 2018-03-29 NOTE — Progress Notes (Signed)
Subjective:     Patient ID: Kristi Davies, female   DOB: [birth year],   MRN: [id number]  HPI  42 yowf never smoker and @ around age 42-13 noted poor ex tol ? EIA worse with extremes of temp and never dx or rx and post term pregancy check up ? 6 month p partum at age 42 wheezy per ob > rx flovent seemed to help then around 2014 worse on flovent and started needing regular albuterol then severe cold spring 2018 and persistent cough since > tried on on BREO made it worse, advair no better but symbiocort ? 160 strength some better but cough still triggered by smells/ perfume and needing neb up to twice a week so referred to pulmonary clinic 03/29/2018 by Dr   Kristi Davies    03/29/2018 1st [facility] office visit/ [facility]   Chief Complaint  Patient presents with  . Pulmonary Consult    Referred by Kristi LanPenny Jones, PA. She was dxed with Asthma approx 20 yrs ago. She has had increased cough over the past year- non prod.  She also c/o SOB that comes and goes- worse with exercise and cleaning. She is using her albuterol inhaler 1 x per wk and neb with albuterol 2 x per wk on average.   still doe x nature hike up hills /eliptical x 15-20 min slow moderate but doet not provoke the cough  Also dx of sinusitis in past but no ent eval or sinus CT  Allergy shots x 6 months at [facility] never able to get above lowest strength>stopped around 2014  "peak flows are always nl Cough is dry daytime > noct  "having to use cough drops all the time".   No obvious other patterns in day to day or daytime variability or assoc excess/ purulent sputum or mucus plugs or hemoptysis or cp or chest tightness, subjective wheeze or overt sinus or hb symptoms. No unusual exposure hx or h/o childhood pna/   or knowledge of premature birth.  Sleeping  Fine most nocts  without nocturnal  or early am exacerbation  of respiratory  c/o's or need for noct saba. Also denies any obvious fluctuation of symptoms with weather or environmental  changes or other aggravating or alleviating factors except as outlined above   Current Allergies, Complete Past Medical History, Past Surgical History, Family History, and Social History were reviewed in [facility] electronic medical record.  ROS  The following are not active complaints unless bolded Hoarseness, sore throat, dysphagia, dental problems, itching, sneezing,  nasal congestion or discharge of excess mucus or purulent secretions, ear ache,   fever, chills, sweats, unintended wt loss or wt gain, classically pleuritic or exertional cp,  orthopnea pnd or arm/hand swelling  or leg swelling, presyncope, palpitations, abdominal pain, anorexia, nausea, vomiting, diarrhea  or change in bowel habits or change in bladder habits, change in stools or change in urine, dysuria, hematuria,  rash, arthralgias, visual complaints, headache, numbness, weakness or ataxia or problems with walking or coordination,  change in mood or  memory.        Current Meds  Medication Sig  . albuterol (PROVENTIL HFA;VENTOLIN HFA) 108 (90 Base) MCG/ACT inhaler Inhale 1-2 puffs into the lungs every 6 (six) hours as needed for wheezing or shortness of breath.  Marland Kitchen. albuterol (PROVENTIL) (2.5 MG/3ML) 0.083% nebulizer solution 1 vial in neb every 6 hours as needed  . clonazePAM (KLONOPIN) 0.5 MG tablet TAKE 1 TABLET BY MOUTH 2 TIMES DAILY AS NEEDED FOR ANXIETY  .  cyclobenzaprine (FLEXERIL) 10 MG tablet Take 10 mg by mouth 3 (three) times daily as needed for muscle spasms.  Marland Kitchen lamoTRIgine (LAMICTAL) 150 MG tablet Take 150 mg by mouth daily.  . metFORMIN (GLUCOPHAGE) 1000 MG tablet TAKE 1 TABLET BY MOUTH TWICE A DAY WITH MEALS  . montelukast (SINGULAIR) 10 MG tablet Take 10 mg by mouth at bedtime.  . nitrofurantoin (MACRODANTIN) 100 MG capsule Take 100 mg by mouth at bedtime.  Marland Kitchen QUEtiapine (SEROQUEL) 100 MG tablet Take 100 mg by mouth at bedtime.  Marland Kitchen zolpidem (AMBIEN) 5 MG tablet Take 5 mg by mouth at bedtime as needed for  sleep.  . [  budesonide-formoterol (SYMBICORT) 160-4.5 MCG/ACT inhaler Inhale 2 puffs into the lungs 2 (two) times daily.                          Review of Systems     Objective:   Physical Exam    amb pleasant wf nad  Wt Readings from Last 3 Encounters:  03/29/18 187 lb 3.2 oz (84.9 kg)  11/12/17 193 lb 12.8 oz (87.9 kg)  05/13/17 194 lb (88 kg)     Vital signs reviewed - Note on arrival 02 sats  100% on RA   HEENT: nl dentition, turbinates bilaterally, and oropharynx. Nl external ear canals without cough reflex   NECK :  without JVD/Nodes/TM/ nl carotid upstrokes bilaterally   LUNGS: no acc muscle use,  Nl contour chest which is clear to A and P bilaterally without cough on insp or exp maneuvers   CV:  RRR  no s3 or murmur or increase in P2, and no edema   ABD:  soft and nontender with nl inspiratory excursion in the supine position. No bruits or organomegaly appreciated, bowel sounds nl  MS:  Nl gait/ ext warm without deformities, calf tenderness, cyanosis or clubbing No obvious joint restrictions   SKIN: warm and dry without lesions    NEURO:  alert, approp, nl sensorium with  no motor or cerebellar deficits apparent.     CXR PA and Lateral:   03/29/2018 :    I personally reviewed images and   impression as follows:    wnl         Assessment:

## 2018-03-30 NOTE — Progress Notes (Signed)
Left detailed msg with results

## 2018-03-31 ENCOUNTER — Other Ambulatory Visit: Payer: 59

## 2018-04-04 ENCOUNTER — Ambulatory Visit (INDEPENDENT_AMBULATORY_CARE_PROVIDER_SITE_OTHER)
Admission: RE | Admit: 2018-04-04 | Discharge: 2018-04-04 | Disposition: A | Discharge status: Home / Self Care | Payer: 59 | Source: Ambulatory Visit | Attending: Internal Medicine | Admitting: Internal Medicine

## 2018-04-04 DIAGNOSIS — R059 Cough, unspecified: Secondary | ICD-10-CM

## 2018-04-04 DIAGNOSIS — J45991 Cough variant asthma: Secondary | ICD-10-CM

## 2018-04-04 DIAGNOSIS — R05 Cough: Secondary | ICD-10-CM

## 2018-04-05 ENCOUNTER — Telehealth: Payer: Self-pay | Admitting: Internal Medicine

## 2018-04-05 NOTE — Telephone Encounter (Signed)
Notes recorded by Nyoka Cowden, MD on 04/05/2018 at 11:13 AM EDT Call patient : Study is unremarkable, no change in recs  Spoke with pt and notified of results per Dr. Sherene Sires. Pt verbalized understanding and denied any questions.

## 2018-04-05 NOTE — Progress Notes (Signed)
LMTCB

## 2018-04-06 ENCOUNTER — Encounter: Payer: Self-pay | Admitting: Internal Medicine

## 2018-04-06 NOTE — Progress Notes (Signed)
See 04/05/18 PN- pt already informed of results

## 2018-04-06 NOTE — Telephone Encounter (Signed)
[facility] POOL    From: Jennifer Payes    Created: 04/06/2018 3:26 PM     *-*-*This message has not been handled.*-*-*  My chronic cough has gotten much worse since I the visit on 4/23. I am now being woken up at night and the Tessalon perles are not helping. Is it possible that lowering the Symbicort dosage has done this? The cough is dry with no mucus.     MW please advise. Thanks  AVS: 03/29/18  3. Nyoka Cowden, MD (Physician) at 03/29/2018 4:18 PM - Signed     For cough  > tessalon 200 mg every 8 hours as neede d  Try symbicort 80 Take 2 puffs first thing in am and then another 2 puffs about 12 hours later.   Only use your albuterol as a rescue medication to be used if you can't catch your breath by resting or doing a relaxed purse lip breathing pattern.  - The less you use it, the better it will work when you need it. - Ok to use up to 2 puffs  every 4 hours if you must but call for immediate appointment if use goes up over your usual need - Don't leave home without it !!  (think of it like the spare tire for your car)    Pantoprazole (protonix) 40 mg   Take  30-60 min before first meal of the day and Pepcid (famotidine)  20 mg one hour before   bedtime until return to office - this is the best way to tell whether stomach acid is contributing to your problem.    GERD (REFLUX)  is an extremely common cause of respiratory symptoms just like yours , many times with no obvious heartburn at all.    It can be treated with medication, but also with lifestyle changes including elevation of the head of your bed (ideally with 6 inch  bed blocks),  Smoking cessation, avoidance of late meals, excessive alcohol, and avoid fatty foods, chocolate, peppermint, colas, red wine, and acidic juices such as orange juice.  NO MINT OR MENTHOL PRODUCTS SO NO COUGH DROPS   USE SUGARLESS CANDY INSTEAD (Jolley ranchers or Stover's or Life Savers) or even ice chips will also do - the key  is to swallow to prevent all throat clearing. NO OIL BASED VITAMINS - use powdered substitutes.  Please see patient coordinator before you leave today  to schedule sinus CT     Please remember to go to the  x-ray department downstairs in the basement  for your tests - we will call you with the results when they are available.     Please schedule a follow up office visit in 4 weeks, sooner if needed

## 2018-04-06 NOTE — Telephone Encounter (Signed)
Let her know it is possible and the best way to tell is just take a 6 day course of prednisone and stay on the 80 strength until return  Prednisone 10 mg take  4 each am x 2 days,   2 each am x 2 days,  1 each am x 2 days and stop

## 2018-04-07 MED ORDER — PREDNISONE 10 MG PO TABS: ORAL_TABLET | ORAL | 0 refills | Status: DC

## 2018-04-27 ENCOUNTER — Other Ambulatory Visit (INDEPENDENT_AMBULATORY_CARE_PROVIDER_SITE_OTHER): Payer: 59

## 2018-04-27 ENCOUNTER — Encounter: Payer: Self-pay | Admitting: Internal Medicine

## 2018-04-27 ENCOUNTER — Ambulatory Visit: Payer: 59 | Admitting: Internal Medicine

## 2018-04-27 VITALS — BP 112/74 | HR 90 | Ht 63.0 in | Wt 187.2 lb

## 2018-04-27 DIAGNOSIS — R0609 Other forms of dyspnea: Secondary | ICD-10-CM

## 2018-04-27 DIAGNOSIS — J45991 Cough variant asthma: Secondary | ICD-10-CM

## 2018-04-27 LAB — CBC WITH DIFFERENTIAL/PLATELET
BASOS ABS: 0.1 10*3/uL (ref 0.0–0.1)
Basophils Relative: 0.8 % (ref 0.0–3.0)
EOS ABS: 0.1 10*3/uL (ref 0.0–0.7)
Eosinophils Relative: 1.4 % (ref 0.0–5.0)
HEMATOCRIT: 40.6 % (ref 36.0–46.0)
Hemoglobin: 13.6 g/dL (ref 12.0–15.0)
LYMPHS PCT: 35.2 % (ref 12.0–46.0)
Lymphs Abs: 2.9 10*3/uL (ref 0.7–4.0)
MCHC: 33.4 g/dL (ref 30.0–36.0)
MCV: 88.7 fl (ref 78.0–100.0)
MONOS PCT: 6.5 % (ref 3.0–12.0)
Monocytes Absolute: 0.5 10*3/uL (ref 0.1–1.0)
NEUTROS PCT: 56.1 % (ref 43.0–77.0)
Neutro Abs: 4.6 10*3/uL (ref 1.4–7.7)
Platelets: 281 10*3/uL (ref 150.0–400.0)
RBC: 4.58 Mil/uL (ref 3.87–5.11)
RDW: 13.3 % (ref 11.5–15.5)
WBC: 8.2 10*3/uL (ref 4.0–10.5)

## 2018-04-27 LAB — SEDIMENTATION RATE: SED RATE: 20 mm/h (ref 0–20)

## 2018-04-27 MED ORDER — PREDNISONE 10 MG PO TABS: ORAL_TABLET | ORAL | 0 refills | Status: DC

## 2018-04-27 NOTE — Progress Notes (Signed)
Subjective:     Patient ID: Kristi Davies, female   DOB: [birth year],   MRN: [id number]    Brief patient profile:  41 yowf never smoker and @ around age [age] noted poor ex tol ? EIA worse with extremes of temp and never dx or rx and post term pregancy check up ? 6 month p partum at age 70 wheezy per ob > rx flovent seemed to help then around 2014 worse on flovent and started needing regular albuterol then severe cold spring 2018 and persistent cough since > tried on on BREO made it worse, advair no better but symbiocort ? 160 strength some better but cough still triggered by smells/ perfume and needing neb up to twice a week so referred to pulmonary clinic 03/29/2018 by Dr   Zoe Lan     History of Present Illness  03/29/2018 1st [facility] office visit/ Cyndel Griffey   Chief Complaint  Patient presents with  . Pulmonary Consult    Referred by Zoe Lan, PA. She was dxed with Asthma approx 20 yrs ago. She has had increased cough over the past year- non prod.  She also c/o SOB that comes and goes- worse with exercise and cleaning. She is using her albuterol inhaler 1 x per wk and neb with albuterol 2 x per wk on average.   still doe x nature hike up hills /eliptical x 15-20 min slow moderate but doet not provoke the cough  Also dx of sinusitis in past but no ent eval or sinus CT  Allergy shots x 6 months at [facility] never able to get above lowest strength>stopped around 2014  "peak flows are always nl Cough is dry daytime > noct  "having to use cough drops all the time". rec For cough  > tessalon 200 mg every 8 hours as needed Try symbicort 80 Take 2 puffs first thing in am and then another 2 puffs about 12 hours later.  Only use your albuterol as a rescue medication  Pantoprazole (protonix) 40 mg   Take  30-60 min before first meal of the day and Pepcid (famotidine)  20 mg one hour before   bedtime until return to office - this is the best way to tell whether stomach acid is contributing to  your problem.   GERD diet. Please see patient coordinator before you leave today  to schedule sinus CT      04/27/2018  f/u ov/Freda Jaquith re: cough on macrodantn  Chief Complaint  Patient presents with  . Follow-up    Cough and SOB have been worse since the last visit. She has had a sore throat for the past wk. Cough has been non prod. She has been using her albuterol inhaler at least 2 x daily and neb with albuterol once per wk on average.    Dyspnea:  Can't get a full breath in Cough: worse with ex Sleep: ok  SABA use:  As above, just with activity   Tessalon seems to help when takes it but presently using no more than once a day  No obvious day to day or daytime variability or assoc excess/ purulent sputum or mucus plugs or hemoptysis or cp or chest tightness, subjective wheeze or overt sinus or hb symptoms. No unusual exposure hx or h/o childhood pna/ asthma or knowledge of premature birth.  Sleeping  Ok   without nocturnal  or early am exacerbation  of respiratory  c/o's or need for noct saba. Also denies any obvious fluctuation of  symptoms with weather or environmental changes or other aggravating or alleviating factors except as outlined above   Current Allergies, Complete Past Medical History, Past Surgical History, Family History, and Social History were reviewed in [facility] record.  ROS  The following are not active complaints unless bolded Hoarseness, sore throat, dysphagia, dental problems, itching, sneezing,  nasal congestion or discharge of excess mucus or purulent secretions, ear ache,   fever, chills, sweats, unintended wt loss or wt gain, classically pleuritic or exertional cp,  orthopnea pnd or arm/hand swelling  or leg swelling, presyncope, palpitations, abdominal pain, anorexia, nausea, vomiting, diarrhea  or change in bowel habits or change in bladder habits, change in stools or change in urine, dysuria, hematuria,  rash, arthralgias, visual  complaints, headache, numbness, weakness or ataxia or problems with walking or coordination,  change in mood or  memory.        Current Meds  Medication Sig  . albuterol (PROVENTIL HFA;VENTOLIN HFA) 108 (90 Base) MCG/ACT inhaler Inhale 1-2 puffs into the lungs every 6 (six) hours as needed for wheezing or shortness of breath.  Marland Kitchen albuterol (PROVENTIL) (2.5 MG/3ML) 0.083% nebulizer solution 1 vial in neb every 6 hours as needed  . benzonatate (TESSALON) 200 MG capsule Take 1 capsule (200 mg total) by mouth 3 (three) times daily as needed for cough.  . budesonide-formoterol (SYMBICORT) 80-4.5 MCG/ACT inhaler .Take 2 puffs first thing in am and then another 2 puffs about 12 hours later.  . clonazePAM (KLONOPIN) 0.5 MG tablet TAKE 1 TABLET BY MOUTH 2 TIMES DAILY AS NEEDED FOR ANXIETY  . cyclobenzaprine (FLEXERIL) 10 MG tablet Take 10 mg by mouth 3 (three) times daily as needed for muscle spasms.  . famotidine (PEPCID) 20 MG tablet One at bedtime  . lamoTRIgine (LAMICTAL) 150 MG tablet Take 150 mg by mouth daily.  . metFORMIN (GLUCOPHAGE) 1000 MG tablet TAKE 1 TABLET BY MOUTH TWICE A DAY WITH MEALS  . montelukast (SINGULAIR) 10 MG tablet Take 10 mg by mouth at bedtime.  . pantoprazole (PROTONIX) 40 MG tablet Take 1 tablet (40 mg total) by mouth daily. Take 30-60 min before first meal of the day  . QUEtiapine (SEROQUEL) 100 MG tablet Take 100 mg by mouth at bedtime.  Marland Kitchen zolpidem (AMBIEN) 5 MG tablet Take 5 mg by mouth at bedtime as needed for sleep.  . [  nitrofurantoin (MACRODANTIN) 100 MG capsule Take 100 mg by mouth at bedtime.            Objective:   Physical Exam     amb wf nad    04/27/2018       187   03/29/18 187 lb 3.2 oz (84.9 kg)  11/12/17 193 lb 12.8 oz (87.9 kg)  05/13/17 194 lb (88 kg)    Vital signs reviewed - Note on arrival 02 sats  97% on RA    HEENT: nl dentition, turbinates bilaterally, and oropharynx. Nl external ear canals without cough reflex   NECK :  without  JVD/Nodes/TM/ nl carotid upstrokes bilaterally   LUNGS: no acc muscle use,  Nl contour chest which is clear to A and P bilaterally with  cough at end insp maneuvers   CV:  RRR  no s3 or murmur or increase in P2, and no edema   ABD:  soft and nontender with nl inspiratory excursion in the supine position. No bruits or organomegaly appreciated, bowel sounds nl  MS:  Nl gait/ ext warm without deformities, calf  tenderness, cyanosis or clubbing No obvious joint restrictions   SKIN: warm and dry without lesions    NEURO:  alert, approp, nl sensorium with  no motor or cerebellar deficits apparent.     Labs ordered 04/27/2018   allergy profile     Lab Results  Component Value Date   ESRSEDRATE 20 04/27/2018          Assessment:

## 2018-04-27 NOTE — Patient Instructions (Addendum)
Take the tessalon more aggressively to suppress the urge to cough - three times daily   Stop macrodantin and tell your urologist you may have marcodantin pulmonary side effects  Prednisone 10 mg Take 4 for three days 3 for three days 2 for three days 1 for three days and stop   Please remember to go to the lab department downstairs in the basement  for your tests - we will call you with the results when they are available.   Please schedule a follow up office visit in 6 weeks, call sooner if needed

## 2018-04-28 ENCOUNTER — Encounter: Payer: Self-pay | Admitting: Internal Medicine

## 2018-04-28 DIAGNOSIS — R0609 Other forms of dyspnea: Secondary | ICD-10-CM

## 2018-04-28 LAB — RESPIRATORY ALLERGY PROFILE REGION II ~~LOC~~
Allergen, A. alternata, m6: <0.1 kU/L
Allergen, Comm Silver Birch, t9: <0.1 kU/L
Allergen, D pternoyssinus,d7: 0.22 kU/L — ABNORMAL HIGH
Allergen, Mouse Urine Protein, e78: <0.1 kU/L
Allergen, Oak,t7: <0.1 kU/L
CLASS: 0
CLASS: 0
CLASS: 0
CLASS: 0
CLASS: 0
CLASS: 0
CLASS: 0
CLASS: 0
CLASS: 1
COMMON RAGWEED (SHORT) (W1) IGE: <0.1 kU/L
Cat Dander: <0.1 kU/L
Class: 0
Class: 0
Class: 0
Class: 0
Class: 0
Class: 0
Class: 0
Class: 0
Class: 0
Class: 0
Class: 0
Class: 0
Class: 0
Class: 0
Class: 0
Cockroach: 0.68 kU/L — ABNORMAL HIGH
D. farinae: 0.34 kU/L — ABNORMAL HIGH
Elm IgE: <0.1 kU/L
IgE (Immunoglobulin E), Serum: 29 kU/L (ref <=114)
Johnson Grass: <0.1 kU/L
Pecan/Hickory Tree IgE: <0.1 kU/L
Timothy Grass: <0.1 kU/L

## 2018-04-28 LAB — INTERPRETATION:

## 2018-04-28 NOTE — Assessment & Plan Note (Addendum)
-  [name] 03/29/2018  =   11 on symb 160  - 03/29/2018  After extensive coaching inhaler device  effectiveness =    90% with spacer > try reduce symbicort to 80 2bid - Spirometry 03/29/2018  Nl with no curvature on symb 160  Sinus CT 04/04/18 > Well-aerated paranasal sinuses   Allergy profile 04/27/2018 >  Eos 0.1 /  IgE   - Stop macrodantin 04/27/2018 with esr 20  rx   pred x 12 days   Cough on insp is either uacs or may represent early ild from macrodantin and that's why she reports improvement only on prednisone so reasonable to try off x 6 weeks and if still coughing then HRCT/ mct next steps.  In the meantime needs to max rx of gerd and eliminate cyclical coughing with tessalon since seems to help but rarely using    I had an extended discussion with the patient reviewing all relevant studies completed to date and  lasting 15 to 20 minutes of a 25 minute visit    Each maintenance medication was reviewed in detail including most importantly the difference between maintenance and prns and under what circumstances the prns are to be triggered using an action plan format that is not reflected in the computer generated alphabetically organized AVS.    Please see AVS for specific instructions unique to this visit that I personally wrote and verbalized to the the pt in detail and then reviewed with pt  by my nurse highlighting any  changes in therapy recommended at today's visit to their plan of care.

## 2018-04-28 NOTE — Assessment & Plan Note (Signed)
-  [name] 03/29/2018  =   11 on symb 160  - 03/29/2018  After extensive coaching inhaler device  effectiveness =    90% with spacer > try reduce symbicort to 80 2bid - Spirometry 03/29/2018  Nl with no curvature on symb 160  Sinus CT 04/04/18 > Well-aerated paranasal sinuses   Allergy profile 04/27/2018 >  Eos 0.1 /  IgE   - Stop macrodantin 04/27/2018 with esr 20  rx   pred x 12 days

## 2018-04-29 NOTE — Progress Notes (Signed)
Left detailed msg with results ok per DPR 

## 2018-06-10 ENCOUNTER — Encounter: Payer: Self-pay | Admitting: Internal Medicine

## 2018-06-10 ENCOUNTER — Ambulatory Visit: Payer: 59 | Admitting: Internal Medicine

## 2018-06-10 VITALS — BP 108/74 | HR 97 | Ht 63.0 in | Wt 187.4 lb

## 2018-06-10 DIAGNOSIS — R0609 Other forms of dyspnea: Secondary | ICD-10-CM | POA: Diagnosis not present

## 2018-06-10 DIAGNOSIS — J45991 Cough variant asthma: Secondary | ICD-10-CM | POA: Diagnosis not present

## 2018-06-10 NOTE — Patient Instructions (Signed)
Work on inhaler technique:  relax and gently blow all the way out then take a nice smooth deep breath back in, triggering the inhaler at same time you start breathing in.  Hold for up to 5 seconds if you can. Blow out symbicort out thru nose. Rinse and gargle with water when done   Please schedule a follow up visit in 6  months but call sooner if needed        

## 2018-06-10 NOTE — Progress Notes (Signed)
Subjective:     Patient ID: Kristi Davies, female   DOB: 01/29/1976,   MRN: [id number]    Brief patient profile:  41 yowf never smoker and @ around age [age] noted poor ex tol ? EIA worse with extremes of temp and never dx or rx and post term pregancy check up ? 6 month p partum at age 42 wheezy per ob > rx flovent seemed to help then around 2014 worse on flovent and started needing regular albuterol then severe cold spring 2018 and persistent cough since > tried on on BREO made it worse, advair no better but symbiocort ? 160 strength some better but cough still triggered by smells/ perfume and needing neb up to twice a week so referred to pulmonary clinic 03/29/2018 by Dr   Zoe LanPenny Jones     History of Present Illness  03/29/2018 1st [facility] office visit/ Kristi Davies   Chief Complaint  Patient presents with  . Pulmonary Consult    Referred by Zoe LanPenny Jones, PA. She was dxed with Asthma approx 20 yrs ago. She has had increased cough over the past year- non prod.  She also c/o SOB that comes and goes- worse with exercise and cleaning. She is using her albuterol inhaler 1 x per wk and neb with albuterol 2 x per wk on average.   still doe x nature hike up hills /eliptical x 15-20 min slow moderate but doet not provoke the cough  Also dx of sinusitis in past but no ent eval or sinus CT  Allergy shots x 6 months at [facility] never able to get above lowest strength>stopped around 2014  "peak flows are always nl Cough is dry daytime > noct  "having to use cough drops all the time". rec For cough  > tessalon 200 mg every 8 hours as needed Try symbicort 80 Take 2 puffs first thing in am and then another 2 puffs about 12 hours later.  Only use your albuterol as a rescue medication  Pantoprazole (protonix) 40 mg   Take  30-60 min before first meal of the day and Pepcid (famotidine)  20 mg one hour before   bedtime until return to office - this is the best way to tell whether stomach acid is contributing to  your problem.   GERD diet. Please see patient coordinator before you leave today  to schedule sinus CT      04/27/2018  f/u ov/Kristi Davies re: cough on macrodantn  Chief Complaint  Patient presents with  . Follow-up    Cough and SOB have been worse since the last visit. She has had a sore throat for the past wk. Cough has been non prod. She has been using her albuterol inhaler at least 2 x daily and neb with albuterol once per wk on average.   Dyspnea:  Can't get a full breath in Cough: worse with ex Sleep: ok  SABA use:  As above, just with activity   Tessalon seems to help when takes it but presently using no more than once a day rec Avoid macrodantin/ no  Change in rx  06/10/2018  f/u ov/Kristi Davies re: cough variant asthma on sym 80/singulair maint  Chief Complaint  Patient presents with  . Follow-up    Breathing is overall doing well. She only uses her albuterol inhaler before exercise.  Dyspnea:  Uses saba before late afternoon marshall arts/ eliptical otherwise sob  Cough: none  SABA use: just with ex as above    No obvious  day to day or daytime variability or assoc excess/ purulent sputum or mucus plugs or hemoptysis or cp or chest tightness, subjective wheeze or overt sinus or hb symptoms.   Sleeping: ok without nocturnal  or early am exacerbation  of respiratory  c/o's or need for noct saba. Also denies any obvious fluctuation of symptoms with weather or environmental changes or other aggravating or alleviating factors except as outlined above   No unusual exposure hx or h/o childhood pna/ asthma or knowledge of premature birth.  Current Allergies, Complete Past Medical History, Past Surgical History, Family History, and Social History were reviewed in [employer] record.  ROS  The following are not active complaints unless bolded Hoarseness, sore throat, dysphagia, dental problems, itching, sneezing,  nasal congestion or discharge of excess mucus or purulent  secretions, ear ache,   fever, chills, sweats, unintended wt loss or wt gain, classically pleuritic or exertional cp,  orthopnea pnd or arm/hand swelling  or leg swelling, presyncope, palpitations, abdominal pain, anorexia, nausea, vomiting, diarrhea  or change in bowel habits or change in bladder habits, change in stools or change in urine, dysuria, hematuria,  rash, arthralgias, visual complaints, headache, numbness, weakness or ataxia or problems with walking or coordination,  change in mood or  memory.        Current Meds  Medication Sig  . albuterol (PROVENTIL HFA;VENTOLIN HFA) 108 (90 Base) MCG/ACT inhaler Inhale 1-2 puffs into the lungs every 6 (six) hours as needed for wheezing or shortness of breath.  Marland Kitchen albuterol (PROVENTIL) (2.5 MG/3ML) 0.083% nebulizer solution 1 vial in neb every 6 hours as needed  . benzonatate (TESSALON) 200 MG capsule Take 1 capsule (200 mg total) by mouth 3 (three) times daily as needed for cough.  . budesonide-formoterol (SYMBICORT) 80-4.5 MCG/ACT inhaler .Take 2 puffs first thing in am and then another 2 puffs about 12 hours later.  . clonazePAM (KLONOPIN) 0.5 MG tablet TAKE 1 TABLET BY MOUTH 2 TIMES DAILY AS NEEDED FOR ANXIETY  . cyclobenzaprine (FLEXERIL) 10 MG tablet Take 10 mg by mouth 3 (three) times daily as needed for muscle spasms.  . famotidine (PEPCID) 20 MG tablet One at bedtime  . lamoTRIgine (LAMICTAL) 150 MG tablet Take 150 mg by mouth daily.  . metFORMIN (GLUCOPHAGE) 1000 MG tablet TAKE 1 TABLET BY MOUTH TWICE A DAY WITH MEALS  . montelukast (SINGULAIR) 10 MG tablet Take 10 mg by mouth at bedtime.  . pantoprazole (PROTONIX) 40 MG tablet Take 1 tablet (40 mg total) by mouth daily. Take 30-60 min before first meal of the day  . zolpidem (AMBIEN) 5 MG tablet Take 5 mg by mouth at bedtime as needed for sleep.                Objective:   Physical Exam     amb wf nad   06/10/2018          187   04/27/2018       187   03/29/18 187 lb 3.2 oz  (84.9 kg)  11/12/17 193 lb 12.8 oz (87.9 kg)  05/13/17 194 lb (88 kg)    Vital signs reviewed - Note on arrival 02 sats  96% on RA     HEENT: nl dentition, turbinates bilaterally, and oropharynx. Nl external ear canals without cough reflex   NECK :  without JVD/Nodes/TM/ nl carotid upstrokes bilaterally   LUNGS: no acc muscle use,  Nl contour chest which is clear to A and P  bilaterally without cough on insp or exp maneuvers   CV:  RRR  no s3 or murmur or increase in P2, and no edema   ABD:  soft and nontender with nl inspiratory excursion in the supine position. No bruits or organomegaly appreciated, bowel sounds nl  MS:  Nl gait/ ext warm without deformities, calf tenderness, cyanosis or clubbing No obvious joint restrictions   SKIN: warm and dry without lesions    NEURO:  alert, approp, nl sensorium with  no motor or cerebellar deficits apparent.              Assessment:

## 2018-06-11 ENCOUNTER — Encounter: Payer: Self-pay | Admitting: Internal Medicine

## 2018-06-11 NOTE — Assessment & Plan Note (Signed)
-  [name] 03/29/2018  =   11 on symb 160  - 03/29/2018  After extensive coaching inhaler device  effectiveness =    90% with spacer > try reduce symbicort to 80 2bid - Spirometry 03/29/2018  Nl with no curvature on symb 160  Sinus CT 04/04/18 > Well-aerated paranasal sinuses  Allergy profile 04/27/2018 >  Eos 0.1 /  IgE  29 RAST only pos dust/ roaches - Stop macrodantin 04/27/2018 with esr 20  rx   pred x 12 days > complete resolution of all symptoms   - 06/10/2018  After extensive coaching inhaler device  effectiveness =    75% s spacer which she prefers not using    Despite suboptimal hfa > All goals of chronic asthma control met including optimal function and elimination of symptoms with minimal need for rescue therapy.  Contingencies discussed in full including contacting this office immediately if not controlling the symptoms using the rule of two's.     F/u can be q 6 m  I had an extended discussion with the patient reviewing all relevant studies completed to date and  lasting 15 to 20 minutes of a 25 minute visit    See device teaching which extended face to face time for this visit.  Each maintenance medication was reviewed in detail including emphasizing most importantly the difference between maintenance and prns and under what circumstances the prns are to be triggered using an action plan format that is not reflected in the computer generated alphabetically organized AVS which I have not found useful in most complex patients, especially with respiratory illnesses  Please see AVS for specific instructions unique to this visit that I personally wrote and verbalized to the the pt in detail and then reviewed with pt  by my nurse highlighting any  changes in therapy recommended at today's visit to their plan of care.

## 2018-06-11 NOTE — Assessment & Plan Note (Signed)
04/27/2018   Walked RA  2 laps @ 185 ft each stopped due to sob / fast pace/ no desat - try off macrodantin 04/27/2018  > resolved 100%   Although even in retrospect it may not be clear the marcodantin contributed to the pt's symptoms,  Pt improved off it and adding  back at this point or in the future would risk confusion in interpretation of non-specific respiratory symptoms to which this patient is prone  ie  Better not to muddy the waters here.  She has discussed this with urology.

## 2018-08-22 ENCOUNTER — Other Ambulatory Visit: Payer: Self-pay | Admitting: Internal Medicine

## 2018-09-20 ENCOUNTER — Encounter: Payer: Self-pay | Admitting: Neurology

## 2018-09-21 ENCOUNTER — Encounter: Payer: Self-pay | Admitting: Neurology

## 2018-09-21 ENCOUNTER — Ambulatory Visit: Payer: BLUE CROSS/BLUE SHIELD | Admitting: Neurology

## 2018-09-21 VITALS — BP 129/85 | HR 85 | Ht 64.0 in | Wt 188.0 lb

## 2018-09-21 DIAGNOSIS — R002 Palpitations: Secondary | ICD-10-CM | POA: Diagnosis not present

## 2018-09-21 DIAGNOSIS — N946 Dysmenorrhea, unspecified: Secondary | ICD-10-CM | POA: Diagnosis not present

## 2018-09-21 DIAGNOSIS — R55 Syncope and collapse: Secondary | ICD-10-CM

## 2018-09-21 DIAGNOSIS — G4721 Circadian rhythm sleep disorder, delayed sleep phase type: Secondary | ICD-10-CM

## 2018-09-21 DIAGNOSIS — J45991 Cough variant asthma: Secondary | ICD-10-CM

## 2018-09-21 DIAGNOSIS — R0789 Other chest pain: Secondary | ICD-10-CM | POA: Diagnosis not present

## 2018-09-21 MED ORDER — ZALEPLON 5 MG PO CAPS: 5.0000 mg | ORAL_CAPSULE | Freq: Every evening | ORAL | 2 refills | Status: DC | PRN

## 2018-09-21 NOTE — Progress Notes (Signed)
SLEEP MEDICINE CLINIC   Provider:  Melvyn Novas, M.D.   Primary Care Physician:  Iona Hansen, NP   Referring Provider: Iona Hansen, NP   This patient is established with Dr. Sherene Sires, Dr Elvera Lennox , Dr Kizzie Bane, and Dr. Allyson Sabal;   Chief Complaint  Patient presents with  . New Patient (Initial Visit)    pt in room 10. pt with husband. for years she has struggled with sleep. she has been keeping sleep logs. she thinks she has circadian sleep disorder.. pt doesnt snore and denies apnea. states that she had a sleep study over 10 years ago.     HPI:  Kristi Davies is a 42 y.o. female patient seen here on 09-21-2018   in a referral from NP Jones for a sleep apnea evaluation , evaluation of excessive daytime sleepiness.  Kristi Davies has suffered from excessive daytime sleepiness and a high degree of fatigue for several months, she was evaluated by pulmonology for a concern of chronic cough, she has asthma- she has been evaluated for polycystic ovarian syndrome and is status post ablation with amenorrhea, been followed by Dr. Elvera Lennox .  In addition Dr. York Ram has worked her up for chest pain from a cardiology standpoint.  A prolonged Holter monitor study did not reveal any cardiac abnormalities.  I appreciate that the patient brought me her sleep diary that shows sats from the beginning of the year she had ongoing difficulties initiating sleep earlier than midnight usually her bedtime and sleep time can be as late as 11:30 AM the average seems to be around 8 AM.  There is however a high degree of variability she will stay asleep some days 7 hours, and other days not even 1 hour.  Average sleep time however seems to be around 7 hours and last until 2:00 PM. She seems to have a delayed sleep phase.   Chief complaint according to patient :  "I drug myself to sleep- in spite of black out curtain, medication, and a quiet , cool, and dark environment.- then  I pay the price of oversleeping "  and need caffeine.  .   Sleep habits are as follows: I appreciate that the patient brought me her sleep diary that shows sats from the beginning of the year she had ongoing difficulties initiating sleep earlier than midnight usually her bedtime and sleep time can be as late as 11:30 AM the average seems to be around 8 AM.  There is however a high degree of variability she will stay asleep some days 7 hours, and other days not even 1 hour.  Average sleep time however seems to be around 7 hours and last until 2:00 PM. She seems to have a delayed sleep phase.  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 She takes cat naps 30 minutes or less, without dreaming .  Her husband gets home working first in the next 40 at 9 Pm and they have dinner between 9 and 10, she tends not to eat up to midnight when retreating to bed , in a cool, quiet an dark room. She takes a hot shower, her dog stays in the bedroom, she may stretch , and she tries to meditate-  Yoga. She often is not asleep but bored- and audio-books stimulate her brain.  She avoids sounds. She may finally sleep at any time between 1- 8 AM-     Sleep medical history and family  sleep history: her daughter has at age 62 a circadian rhythm disorder- and her mother has similar complaint. GERD .   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 Caffeine 2 in AM, soda rarely, non smoker- asthma- ETOH occassionally ,     Review of Systems: Out of a complete 14 system review, the patient complains of only the following symptoms, and all other reviewed systems are negative.  No snoring, no apnea. Cyclic insomnia,  Bipolar depression, entrained abnormal sleep rhythm   Epworth Sleepiness Score: 15/ 24 , Fatigue severity score 55/63 , depression score-    Social History   Socioeconomic History  . Marital status: Married     Spouse name: Not on file  . Number of children: Not on file  . Years of education: Not on file  . Highest education level: Not on file  Occupational History  . Not on file  Social Needs  . Financial resource strain: Not on file  . Food insecurity:    Worry: Not on file    Inability: Not on file  . Transportation needs:    Medical: Not on file    Non-medical: Not on file  Tobacco Use  . Smoking status: Never Smoker  . Smokeless tobacco: Never Used  Substance and Sexual Activity  . Alcohol use: Yes    Alcohol/week: 1.0 standard drinks    Types: 1 Cans of beer per week  . Drug use: No  . Sexual activity: Yes    Birth control/protection: Other-see comments    Comment: vasectomy  Lifestyle  . Physical activity:    Days per week: Not on file    Minutes per session: Not on file  . Stress: Not on file  Relationships  . Social connections:    Talks on phone: Not on file    Gets together: Not on file    Attends religious service: Not on file    Active member of club or organization: Not on file    Attends meetings of clubs or organizations: Not on file    Relationship status: Not on file  . Intimate partner violence:    Fear of current or ex partner: Not on file    Emotionally abused: Not on file    Physically abused: Not on file    Forced sexual activity: Not on file  Other Topics Concern  . Not on file  Social History Narrative   Single   Self-employed   1 daughter   Exercise: yes   Caffeine use: yes    Family History  Problem Relation Age of Onset  . Depression Mother   . Diabetes Mother   . Asthma Mother   . Migraines Mother   . Hypertension Mother   . Diabetes Father   . Heart disease Father   . Hypertension Father   . Dementia Maternal Grandmother   . Breast cancer Maternal Grandmother   . Heart disease Maternal Grandfather   . Dementia Paternal Grandmother   . Stroke Paternal Grandmother   . Arthritis Paternal Grandfather   . Heart disease Paternal  Grandfather     Past Medical History:  Diagnosis Date  . Allergy   . Asthma   . Bipolar disorder (HCC)    Type 2  . Depression   . Headache    Migraines  . IBS (irritable bowel syndrome)   . Insomnia   . PCOS (  polycystic ovarian syndrome)   . Pneumonia    history of  . Vitamin D deficiency     Past Surgical History:  Procedure Laterality Date  . BLADDER SUSPENSION N/A 08/30/2013   Procedure: TRANSVAGINAL TAPE (TVT) PROCEDURE;  Surgeon: Lavina Hamman, MD;  Location: [facility];  Service: Gynecology;  Laterality: N/A;  . EYE SURGERY    . HYSTEROSCOPY WITH NOVASURE N/A 08/30/2013   Procedure: HYSTEROSCOPY WITH NOVASURE;  Surgeon: Lavina Hamman, MD;  Location: [facility];  Service: Gynecology;  Laterality: N/A;  1 1/2 hrs OR time  . LAPAROSCOPIC TUBAL LIGATION Bilateral 11/20/2015   Procedure: LAPAROSCOPIC TUBAL LIGATION;  Surgeon: Lavina Hamman, MD;  Location: [facility];  Service: Gynecology;  Laterality: Bilateral;  . SALPINGECTOMY    . wisdom tooth extraction      Current Outpatient Medications  Medication Sig Dispense Refill  . albuterol (PROVENTIL HFA;VENTOLIN HFA) 108 (90 Base) MCG/ACT inhaler Inhale 1-2 puffs into the lungs every 6 (six) hours as needed for wheezing or shortness of breath.    Marland Kitchen albuterol (PROVENTIL) (2.5 MG/3ML) 0.083% nebulizer solution 1 vial in neb every 6 hours as needed  1  . benzonatate (TESSALON) 200 MG capsule Take 1 capsule (200 mg total) by mouth 3 (three) times daily as needed for cough. 45 capsule 2  . budesonide-formoterol (SYMBICORT) 80-4.5 MCG/ACT inhaler .Take 2 puffs first thing in am and then another 2 puffs about 12 hours later. 1 Inhaler 12  . clonazePAM (KLONOPIN) 0.5 MG tablet TAKE 1 TABLET BY MOUTH 2 TIMES DAILY AS NEEDED FOR ANXIETY  0  . cyclobenzaprine (FLEXERIL) 10 MG tablet Take 10 mg by mouth 3 (three) times daily as needed for muscle spasms.    Marland Kitchen lamoTRIgine (LAMICTAL) 150 MG tablet Take 150 mg by mouth daily.    . metFORMIN  (GLUCOPHAGE) 1000 MG tablet Take 1 tablet (1,000 mg total) by mouth 2 (two) times daily with a meal. NEED APPOINTMENT FOR FURTHER REFILLS 60 tablet 0  . montelukast (SINGULAIR) 10 MG tablet Take 10 mg by mouth at bedtime.    Marland Kitchen zolpidem (AMBIEN) 5 MG tablet Take 5 mg by mouth at bedtime as needed for sleep.     No current facility-administered medications for this visit.     Allergies as of 09/21/2018 - Review Complete 09/21/2018  Allergen Reaction Noted  . Shrimp [shellfish allergy] Anaphylaxis 11/28/2016  . Latex Itching 08/25/2013  . Levaquin [levofloxacin in d5w] Hives 12/30/2012  . Sulfa antibiotics Rash 05/06/2016    Vitals: BP 129/85   Pulse 85   Ht 5\' 4"  (1.626 m)   Wt 188 lb (85.3 kg)   BMI 32.27 kg/m  Last Weight:  Wt Readings from Last 1 Encounters:  09/21/18 188 lb (85.3 kg)   ZOX:WRUE mass index is 32.27 kg/m.     Last Height:   Ht Readings from Last 1 Encounters:  09/21/18 5\' 4"  (1.626 m)    Physical exam:  General: The patient is awake, alert and appears not in acute distress. The patient is well groomed. Head: Normocephalic, atraumatic. Neck is supple. Mallampati 4, overbite -  neck circumference:14.5 ".  Nasal airflow patent  Retrognathia is seen.  Cardiovascular:  Regular rate and rhythm , without  murmurs or carotid bruit, and without distended neck veins. Respiratory: Lungs are clear to auscultation. Skin:  Without evidence of edema, or rash Trunk: BMI is 32.27. The patient's posture is erect.  Neurologic exam : The patient is awake and alert, oriented to place  and time.   Memory subjective described as intact.  Attention span & concentration ability appears normal.  Speech is fluent,  without dysarthria, dysphonia or aphasia.  Mood and affect are appropriate.  Cranial nerves: Pupils are equal and briskly reactive to light. Funduscopic exam without  evidence of pallor or edema. Extraocular movements  - strabismus with enophthalmos and mild ptosis  on the left- in vertical and horizontal planes intact and without nystagmus. Diplopia only when fatigued.  Visual fields by finger perimetry are intact. Hearing to finger rub intact.Facial sensation intact to fine touch. Facial motor strength is symmetric and tongue and uvula move midline. Shoulder shrug was symmetrical.   Motor exam: Normal tone, muscle bulk and symmetric strength in all extremities. Sensory:  Fine touch, pinprick and vibration were tested in all extremities. Proprioception tested in the upper extremities was normal. Coordination: Rapid alternating movements in the fingers/hands was normal. Finger-to-nose maneuver  normal without evidence of ataxia, dysmetria or tremor. Gait and station: Patient walks without assistive device . Turns with 3 Steps.  Deep tendon reflexes: in the  upper and lower extremities are symmetric and intact. Babinski maneuver response is downgoing.   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  She is very concerned because she would like to go to bed earlier successfully initiate sleep and stay asleep helping her to keep her regular office drop in daytime.  She has in the past tried to document the room and get ready for bedtime earlier but has not felt that it helped her to initiate sleep.  She has been able to sleep somewhat this trazodone but only 50 mg which is a reasonable dose however she felt that she was groggy the following day.  Lamictal seems not to have influenced her sleep cycle very much, Klonopin is not frequently used and was not prescribed primarily to induce sleep, the same is true for Flexeril.  She also takes [employer] as needed but has not noticed that it influences her sleep pattern greatly.  She does not usually drink caffeine late in the day knowing that it could affect her sleep latency.  She had extensive lab work just recently done which I reviewed and it looks really all  normal.  She has tried and slept well under Seroquel, but she gained too much weight. Remus Loffler helped temporarily , immediate and XR form. Alfonso Patten never tried, Restaurant manager, fast food never tried. Has failed resetting rhythms after not sleeping for 36 hours. Remeron did not work, doxepin did cause bronchitis.     Assessment:  After physical and neurologic examination, review of laboratory studies,  Personal review of imaging studies, reports of other /same  Imaging studies, results of polysomnography and / or neurophysiology testing and pre-existing records as far as provided in visit., my assessment is   1)  This is a chronic sleep cycle and circadian disorder, and is expected to be very treatment resistant .  I want Mrs. Swart to not go to sleep tonight and initiate sleep before midnight tomorrow.  Try to keep this up, use daylight mimik in AM . Use Melatonin. 2.5 mg 1-2 tabs at 7 PM.  Avoid light before bedtime.  Gave her the boot camp instructions.  3) Take a trial of Sonata 5 mg with your trazodone, try 50 mg. She has sleep walked on Ambien, Doxepin. I will be happy to rule out organic sleep disorder, PLMs, apnea, hypoxemia.  I ordered a  HST.     The patient was advised of the nature of the diagnosed disorder , the treatment options and the  risks for general health and wellness arising from not treating the condition.   I spent more than 60 minutes of face to face time with the patient.  Greater than 50% of time was spent in counseling and coordination of care. We have discussed the diagnosis and differential and I answered the patient's questions.    Plan:  Treatment plan and additional workup :   Dear Zoe Lan, NP    Circadian rhythm disorders are followed by tertiary care center psychiatry/ psychology.  If sleep resetting, Sonata and behavior modification do not work, I would defer to your colleagues at [facility], MD   09/21/2018, 3:06 PM  Certified in Neurology by  ABPN Certified in Sleep Medicine by [facility] [address] [place], [place] [address]

## 2018-10-12 ENCOUNTER — Other Ambulatory Visit: Payer: Self-pay | Admitting: Neurology

## 2018-10-12 ENCOUNTER — Emergency Department (HOSPITAL_BASED_OUTPATIENT_CLINIC_OR_DEPARTMENT_OTHER)
Admission: EM | Admit: 2018-10-12 | Discharge: 2018-10-12 | Disposition: A | Discharge status: Home / Self Care | Payer: 59 | Attending: Emergency Medicine | Admitting: Emergency Medicine

## 2018-10-12 ENCOUNTER — Other Ambulatory Visit: Payer: Self-pay

## 2018-10-12 ENCOUNTER — Encounter (HOSPITAL_BASED_OUTPATIENT_CLINIC_OR_DEPARTMENT_OTHER): Payer: Self-pay | Admitting: Emergency Medicine

## 2018-10-12 ENCOUNTER — Emergency Department (HOSPITAL_BASED_OUTPATIENT_CLINIC_OR_DEPARTMENT_OTHER): Payer: 59

## 2018-10-12 ENCOUNTER — Encounter: Payer: Self-pay | Admitting: Neurology

## 2018-10-12 DIAGNOSIS — Z79899 Other long term (current) drug therapy: Secondary | ICD-10-CM | POA: Insufficient documentation

## 2018-10-12 DIAGNOSIS — J45909 Unspecified asthma, uncomplicated: Secondary | ICD-10-CM | POA: Insufficient documentation

## 2018-10-12 DIAGNOSIS — I309 Acute pericarditis, unspecified: Secondary | ICD-10-CM | POA: Insufficient documentation

## 2018-10-12 DIAGNOSIS — Z91040 Latex allergy status: Secondary | ICD-10-CM | POA: Insufficient documentation

## 2018-10-12 DIAGNOSIS — R05 Cough: Secondary | ICD-10-CM | POA: Diagnosis present

## 2018-10-12 MED ORDER — ACETAMINOPHEN 500 MG PO TABS
1000.0000 mg | ORAL_TABLET | Freq: Once | ORAL | Status: AC
  Administered 2018-10-12: 1000 mg via ORAL
  Filled 2018-10-12: qty 2

## 2018-10-12 MED ORDER — KETOROLAC TROMETHAMINE 60 MG/2ML IM SOLN
60.0000 mg | Freq: Once | INTRAMUSCULAR | Status: AC
  Administered 2018-10-12: 60 mg via INTRAMUSCULAR
  Filled 2018-10-12: qty 2

## 2018-10-12 MED ORDER — ZALEPLON 10 MG PO CAPS: 10.0000 mg | ORAL_CAPSULE | Freq: Every evening | ORAL | 5 refills | Status: DC | PRN

## 2018-10-12 MED ORDER — IPRATROPIUM BROMIDE 0.02 % IN SOLN
0.5000 mg | Freq: Once | RESPIRATORY_TRACT | Status: AC
  Administered 2018-10-12: 0.5 mg via RESPIRATORY_TRACT
  Filled 2018-10-12: qty 2.5

## 2018-10-12 MED ORDER — ALBUTEROL SULFATE (2.5 MG/3ML) 0.083% IN NEBU
5.0000 mg | INHALATION_SOLUTION | Freq: Once | RESPIRATORY_TRACT | Status: AC
  Administered 2018-10-12: 5 mg via RESPIRATORY_TRACT
  Filled 2018-10-12: qty 6

## 2018-10-12 NOTE — ED Notes (Signed)
Patient transported to X-ray 

## 2018-10-12 NOTE — ED Notes (Signed)
Pt on monitor 

## 2018-10-12 NOTE — ED Provider Notes (Signed)
[facility] EMERGENCY DEPARTMENT Provider Note   CSN: [id number] Arrival date & time: 10/12/18  1324     History   Chief Complaint Chief Complaint  Patient presents with  . Cough    HPI Kristi Davies is a 42 y.o. female.  HPI Patient is a 42 year old female presents the emergency department with chest discomfort which is been constant for the past several days.  She has had cough and mild shortness of breath.  History of asthma.  She has been on steroids.  She reports some improvement with her bronchodilators.  No history of DVT or pulmonary embolism.  No unilateral leg swelling.  She reports her discomfort is worse when she lays back and improved when she sits forward.  There is no significant pleuritic component to this.  Her discomfort is central chest in nature.  No history of coronary artery disease.  Past Medical History:  Diagnosis Date  . Allergy   . Asthma   . Bipolar disorder (HCC)    Type 2  . Depression   . Headache    Migraines  . IBS (irritable bowel syndrome)   . Insomnia   . PCOS (polycystic ovarian syndrome)   . Pneumonia    history of  . Vitamin D deficiency     Patient Active Problem List   Diagnosis Date Noted  . DOE (dyspnea on exertion) 04/28/2018  . 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 Fatigue 05/13/2017  . Atypical chest pain 02/12/2017  . Palpitations 02/12/2017  . Syncope 02/12/2017  . PCOS (polycystic ovarian syndrome) 11/12/2015  . Menorrhagia 08/30/2013  . Dysmenorrhea 08/30/2013  . SUI (stress urinary incontinence, female) 08/30/2013    Past Surgical History:  Procedure Laterality Date  . BLADDER SUSPENSION N/A 08/30/2013   Procedure: TRANSVAGINAL TAPE (TVT) PROCEDURE;  Surgeon: Lavina Hamman, MD;  Location: [facility] ORS;  Service: Gynecology;  Laterality: N/A;  . EYE SURGERY    . HYSTEROSCOPY WITH NOVASURE N/A 08/30/2013   Procedure: HYSTEROSCOPY WITH NOVASURE;  Surgeon: Lavina Hamman, MD;  Location: [facility]  ORS;  Service: Gynecology;  Laterality: N/A;  1 1/2 hrs OR time  . LAPAROSCOPIC TUBAL LIGATION Bilateral 11/20/2015   Procedure: LAPAROSCOPIC TUBAL LIGATION;  Surgeon: Lavina Hamman, MD;  Location: [facility] ORS;  Service: Gynecology;  Laterality: Bilateral;  . SALPINGECTOMY    . wisdom tooth extraction       OB History   None      Home Medications    Prior to Admission medications   Medication Sig Start Date End Date Taking? Authorizing Provider  albuterol (PROVENTIL HFA;VENTOLIN HFA) 108 (90 Base) MCG/ACT inhaler Inhale 1-2 puffs into the lungs every 6 (six) hours as needed for wheezing or shortness of breath.    [provider]  albuterol (PROVENTIL) (2.5 MG/3ML) 0.083% nebulizer solution 1 vial in neb every 6 hours as needed 01/20/18   [provider]  benzonatate (TESSALON) 200 MG capsule Take 1 capsule (200 mg total) by mouth 3 (three) times daily as needed for cough. 03/29/18   Nyoka Cowden, MD  budesonide-formoterol (SYMBICORT) 80-4.5 MCG/ACT inhaler .Take 2 puffs first thing in am and then another 2 puffs about 12 hours later. 03/29/18   Nyoka Cowden, MD  clonazePAM (KLONOPIN) 0.5 MG tablet TAKE 1 TABLET BY MOUTH 2 TIMES DAILY AS NEEDED FOR ANXIETY 04/24/16   [provider]  cyclobenzaprine (FLEXERIL) 10 MG tablet Take 10 mg by mouth 3 (three) times daily as needed for muscle  spasms.    [provider]  lamoTRIgine (LAMICTAL) 150 MG tablet Take 150 mg by mouth daily.    [provider]  metFORMIN (GLUCOPHAGE) 1000 MG tablet Take 1 tablet (1,000 mg total) by mouth 2 (two) times daily with a meal. NEED APPOINTMENT FOR FURTHER REFILLS 08/22/18   Carlus Pavlov, MD  montelukast (SINGULAIR) 10 MG tablet Take 10 mg by mouth at bedtime.    [provider]  zaleplon (SONATA) 5 MG capsule Take 1 capsule (5 mg total) by mouth at bedtime as needed for sleep. 09/21/18 10/21/18  Dohmeier, Porfirio Mylar, MD    Family History Family History    Problem Relation Age of Onset  . Depression Mother   . Diabetes Mother   . Asthma Mother   . Migraines Mother   . Hypertension Mother   . Diabetes Father   . Heart disease Father   . Hypertension Father   . Dementia Maternal Grandmother   . Breast cancer Maternal Grandmother   . Heart disease Maternal Grandfather   . Dementia Paternal Grandmother   . Stroke Paternal Grandmother   . Arthritis Paternal Grandfather   . Heart disease Paternal Grandfather     Social History Social History   Tobacco Use  . Smoking status: Never Smoker  . Smokeless tobacco: Never Used  Substance Use Topics  . Alcohol use: Yes    Alcohol/week: 1.0 standard drinks    Types: 1 Cans of beer per week  . Drug use: No     Allergies   Shrimp [shellfish allergy]; Latex; Levaquin [levofloxacin in d5w]; and Sulfa antibiotics   Review of Systems Review of Systems  All other systems reviewed and are negative.    Physical Exam Updated Vital Signs BP (!) 128/91 (BP Location: Right Arm)   Pulse 98   Temp 98.2 F (36.8 C) (Oral)   Resp 18   Ht 5\' 3"  (1.6 m)   Wt 83.9 kg   SpO2 100%   BMI 32.77 kg/m   Physical Exam  Constitutional: She is oriented to person, place, and time. She appears well-developed and well-nourished. No distress.  HENT:  Head: Normocephalic and atraumatic.  Eyes: EOM are normal.  Neck: Normal range of motion.  Cardiovascular: Normal rate and regular rhythm.  Pulmonary/Chest: Effort normal. She has wheezes.  Abdominal: Soft. She exhibits no distension. There is no tenderness.  Musculoskeletal: Normal range of motion.  Neurological: She is alert and oriented to person, place, and time.  Skin: Skin is warm and dry.  Psychiatric: She has a normal mood and affect. Judgment normal.  Nursing note and vitals reviewed.    ED Treatments / Results  Labs (all labs ordered are listed, but only abnormal results are displayed) Labs Reviewed - No data to display  EKG EKG  Interpretation  Date/Time:  Wednesday October 12 2018 13:33:13 EST Ventricular Rate:  95 PR Interval:    QRS Duration: 93 QT Interval:  358 QTC Calculation: 450 R Axis:   74 Text Interpretation:  Sinus rhythm Probable lateral infarct, old No significant change was found Confirmed by Azalia Bilis ([id number]) on 10/12/2018 3:02:39 PM   Radiology Dg Chest 2 View  Result Date: 10/12/2018 CLINICAL DATA:  Chest pressure, short of breath EXAM: CHEST - 2 VIEW COMPARISON:  03/29/2018 FINDINGS: The heart size and mediastinal contours are within normal limits. Both lungs are clear. The visualized skeletal structures are unremarkable. IMPRESSION: No active cardiopulmonary disease. Electronically Signed   By: Adrian Prows.D.  On: 10/12/2018 13:59    Procedures Procedures (including critical care time)  Medications Ordered in ED Medications  albuterol (PROVENTIL) (2.5 MG/3ML) 0.083% nebulizer solution 5 mg (5 mg Nebulization Given 10/12/18 1412)  ipratropium (ATROVENT) nebulizer solution 0.5 mg (0.5 mg Nebulization Given 10/12/18 1412)  ketorolac (TORADOL) injection 60 mg (60 mg Intramuscular Given 10/12/18 1402)  acetaminophen (TYLENOL) tablet 1,000 mg (1,000 mg Oral Given 10/12/18 1402)     Initial Impression / Assessment and Plan / ED Course  I have reviewed the triage vital signs and the nursing notes.  Pertinent labs & imaging results that were available during my care of the patient were reviewed by me and considered in my medical decision making (see chart for details).     Feels better after bronchodilators.  Still on steroids at home.  She was treated for flu approximately 2 weeks ago.  This may represent a post influenza pericarditis given her symptoms that are worse with position.  Doubt DVT.  Doubt pulmonary embolism.  Doubt ACS and dissection.  Doubt pulmonary embolism.  Overall well-appearing.  Vital signs are stable.  No hypoxia.  No indication for additional work-up in the  emergency department.  Close follow-up with her primary care physician.  Patient encouraged to return to the emergency department for new or worsening symptoms  Final Clinical Impressions(s) / ED Diagnoses   Final diagnoses:  Acute pericarditis, unspecified type    ED Discharge Orders    None       Azalia Bilis, MD 10/12/18 [id number]

## 2018-10-12 NOTE — ED Triage Notes (Signed)
Patient states that she has had chest pressure and SOB with Cough x a couple of weeks. The patient states that she has been to Urgent care x 2 - patient has prednisone as well as a solumedrol shot and it has not improved

## 2018-10-12 NOTE — Discharge Instructions (Addendum)
Ibuprofen and tylenol for the pain

## 2018-10-31 ENCOUNTER — Ambulatory Visit: Payer: 59 | Admitting: Neurology

## 2018-10-31 DIAGNOSIS — R002 Palpitations: Secondary | ICD-10-CM

## 2018-10-31 DIAGNOSIS — G4733 Obstructive sleep apnea (adult) (pediatric): Secondary | ICD-10-CM

## 2018-10-31 DIAGNOSIS — G4721 Circadian rhythm sleep disorder, delayed sleep phase type: Secondary | ICD-10-CM

## 2018-10-31 DIAGNOSIS — R55 Syncope and collapse: Secondary | ICD-10-CM

## 2018-10-31 DIAGNOSIS — R0789 Other chest pain: Secondary | ICD-10-CM

## 2018-10-31 DIAGNOSIS — N946 Dysmenorrhea, unspecified: Secondary | ICD-10-CM

## 2018-10-31 DIAGNOSIS — J45991 Cough variant asthma: Secondary | ICD-10-CM

## 2018-11-09 ENCOUNTER — Telehealth: Payer: Self-pay | Admitting: Neurology

## 2018-11-09 NOTE — Telephone Encounter (Signed)
Called patient to discuss sleep study results. No answer at this time. LVM for the patient to call back.   

## 2018-11-09 NOTE — Addendum Note (Signed)
Addended by: Melvyn NovasHMEIER, Juwuan Sedita on: 11/09/2018 12:59 PM   Modules accepted: Orders

## 2018-11-09 NOTE — Telephone Encounter (Signed)
-----   Message from Melvyn Novasarmen Dohmeier, MD sent at 11/09/2018 12:59 PM EST ----- Epworth Sleepiness Score: 15/ 24 points, Fatigue severity score  55/63 points, BMI 32 kg/m2, STUDY RESULTS: Total Recording Time: 8 hours 45 minutes, Valid  test time 7 h 38 min, Total Apnea/Hypopnea Index (AHI): 19.5 /h; RDI: 21.3 /h Average Oxygen Saturation:  93 %; Lowest Oxygen Desaturation: 82  %.  Total Time Oxygen Saturation below 89%: 1.4 minutes.  Average Heart Rate: 74 bpm (between 55 and 106 bpm). IMPRESSION: Moderate Sleep apnea, Obstructive type. Moderately loud snoring.  No oxygen desaturation of clinical significance.  Strong REM sleep accentuation (estimated) AHI in REM 46.9/h.   RECOMMENDATION: The patient has REM accentuated apnea which is  not responsive to a dental device, and should be treated with  CPAP. I will order auto CPAP 5-15 cm water, 3 cm EPR , mask of comfort  and heated humidity.

## 2018-11-09 NOTE — Procedures (Signed)
NAME:  Kristi Davies                                                           DOB: May 07, 1976 MEDICAL RECORD No: [id number]                                      DOS: 11/01/2018  REFERRING PHYSICIAN: Zoe LanPenny Jones, NP STUDY PERFORMED: Home Sleep Test on Watch [facility] HISTORY: Kristi Davies is a 42 y.o. female patient seen here on 09-21-2018  in a referral from NP Jones for a sleep apnea evaluation , evaluation of excessive daytime sleepiness.  Kristi Davies has suffered from excessive daytime sleepiness and a high degree of fatigue for several months, she was evaluated by pulmonology for a concern of chronic cough, she has asthma- she has been evaluated for polycystic ovarian syndrome and is status post ablation with amenorrhea, been followed by Dr. Elvera LennoxGherghe .  In addition Dr. York RamJonathan Barry has worked her up for chest pain from a cardiology standpoint.  A prolonged Holter monitor study did not reveal any cardiac abnormalities. I appreciate that the patient brought me her sleep diary that shows data from the beginning of the year, she had ongoing difficulties initiating sleep earlier than midnight usually her bedtime and sleep time can be as late as 11:30 AM, the average seems to be around 8 AM.  There is however a high degree of variability she will stay asleep some days 7 hours, and other days not even 1 hour.  Average sleep time however seems to be around 7 hours and last until 2:00 PM. She seems to have a delayed sleep phase.    Epworth Sleepiness Score: 15/ 24 points, Fatigue severity score 55/63 points, BMI 32 kg/m2, STUDY RESULTS:  Total Recording Time: 8 hours 45 minutes, Valid test time 7 h 38 min, Total Apnea/Hypopnea Index (AHI):  19.5 /h; RDI: 21.3 /h Average Oxygen Saturation:   93 %; Lowest Oxygen Desaturation: 82 %.  Total Time Oxygen Saturation below 89%: 1.4 minutes.  Average Heart Rate:  74 bpm (between 55 and 106 bpm). IMPRESSION: Moderate Sleep apnea, Obstructive type.  Moderately loud  snoring.  No oxygen desaturation of clinical significance.  Strong REM sleep accentuation (estimated) AHI in REM 46.9/h.   RECOMMENDATION: The patient has REM accentuated apnea which is not responsive to a dental device, and should be treated with CPAP. I will order auto CPAP 5-15 cm water, 3 cm EPR , mask of comfort and heated humidity.   I certify that I have reviewed the raw data recording prior to the issuance of this report in accordance with the standards of the American Academy of Sleep Medicine (AASM). Melvyn Novasarmen Alexsis Kathman, M.D.  11-09-2018     Medical Director of [facility], accredited by the AASM. Diplomat of the ABPN and ABSM.

## 2018-11-10 ENCOUNTER — Encounter: Payer: Self-pay | Admitting: Neurology

## 2018-11-14 ENCOUNTER — Encounter: Payer: Self-pay | Admitting: Internal Medicine

## 2018-11-14 DIAGNOSIS — G4733 Obstructive sleep apnea (adult) (pediatric): Secondary | ICD-10-CM | POA: Insufficient documentation

## 2018-11-14 NOTE — Telephone Encounter (Signed)
Patient is returning a call regarding sleep results

## 2018-11-14 NOTE — Telephone Encounter (Signed)
I called pt. I advised pt that Dr. Vickey Hugerohmeier reviewed their sleep study results and found that pt has mild sleep apnea. Dr. Vickey Hugerohmeier recommends that pt starts treatment with auto CPAP 5-15 cm water pressyre. I reviewed PAP compliance expectations with the pt. Pt is agreeable to starting a CPAP. I advised pt that an order will be sent to a DME, [facility], and [facility] will call the pt within about one week after they file with the pt's insurance. [facility] will show the pt how to use the machine, fit for masks, and troubleshoot the CPAP if needed. A follow up appt was made for insurance purposes with Darrol Angelarolyn Martin, NP on Feb 25,2020 at 8:45 am. Pt verbalized understanding to arrive 15 minutes early and bring their CPAP. A letter with all of this information in it will be mailed to the pt as a reminder. I verified with the pt that the address we have on file is correct. Pt verbalized understanding of results. Pt had no questions at this time but was encouraged to call back if questions arise. I have sent the order to [facility] and have received confirmation that they have received the order.

## 2018-12-12 ENCOUNTER — Ambulatory Visit: Payer: 59 | Admitting: Internal Medicine

## 2018-12-12 VITALS — BP 104/62 | HR 90 | Ht 63.0 in | Wt 186.0 lb

## 2018-12-12 DIAGNOSIS — J45991 Cough variant asthma: Secondary | ICD-10-CM

## 2018-12-12 NOTE — Patient Instructions (Addendum)
I emphasized that nasal steroids (flonase)  have no immediate benefit in terms of improving symptoms.  To help them reached the target tissue, the patient should use Afrin two puffs every 12 hours applied one min before using the nasal steroids.  Afrin should be stopped after no more than 5 days.  If the symptoms worsen, Afrin can be restarted after 5 days off of therapy to prevent rebound congestion from overuse of Afrin.  I also emphasized that in no way are nasal steroids a concern in terms of "addiction".    Please schedule a follow up visit in 12 months but call sooner if needed

## 2018-12-12 NOTE — Progress Notes (Signed)
Subjective:     Patient ID: Kristi Davies, female   DOB: [birth year],   MRN: [id number]    Brief patient profile:  43 yowf never smoker and @ around age 43-13 noted poor ex tol ? EIA worse with extremes of temp and never dx or rx and post term pregancy check up ? 6 month p partum at age 43 wheezy per ob > rx flovent seemed to help then around 2014 worse on flovent and started needing regular albuterol then severe cold spring 2018 and persistent cough since > tried on on BREO made it worse, advair no better but symbiocort ? 160 strength some better but cough still triggered by smells/ perfume and needing neb up to twice a week so referred to pulmonary clinic 03/29/2018 by Dr   Zoe Lan     History of Present Illness  03/29/2018 1st [facility] office visit/ Kristi Davies   Chief Complaint  Patient presents with  . Pulmonary Consult    Referred by Zoe Lan, PA. She was dxed with Asthma approx 20 yrs ago. She has had increased cough over the past year- non prod.  She also c/o SOB that comes and goes- worse with exercise and cleaning. She is using her albuterol inhaler 1 x per wk and neb with albuterol 2 x per wk on average.   still doe x nature hike up hills /eliptical x 15-20 min slow moderate but doet not provoke the cough  Also dx of sinusitis in past but no ent eval or sinus CT  Allergy shots x 6 months at [facility] never able to get above lowest strength>stopped around 2014  "peak flows are always nl Cough is dry daytime > noct  "having to use cough drops all the time". rec For cough  > tessalon 200 mg every 8 hours as needed Try symbicort 80 Take 2 puffs first thing in am and then another 2 puffs about 12 hours later.  Only use your albuterol as a rescue medication  Pantoprazole (protonix) 40 mg   Take  30-60 min before first meal of the day and Pepcid (famotidine)  20 mg one hour before   bedtime until return to office - this is the best way to tell whether stomach acid is contributing to  your problem.   GERD diet. Please see patient coordinator before you leave today  to schedule sinus CT      04/27/2018  f/u ov/Kristi Davies re: cough on macrodantn  Chief Complaint  Patient presents with  . Follow-up    Cough and SOB have been worse since the last visit. She has had a sore throat for the past wk. Cough has been non prod. She has been using her albuterol inhaler at least 2 x daily and neb with albuterol once per wk on average.   Dyspnea:  Can't get a full breath in Cough: worse with ex Sleep: ok  SABA use:  As above, just with activity   Tessalon seems to help when takes it but presently using no more than once a day rec Avoid macrodantin/ no  Change in rx  06/10/2018  f/u ov/Kristi Davies re: cough variant asthma on sym 80/singulair maint  Chief Complaint  Patient presents with  . Follow-up    Breathing is overall doing well. She only uses her albuterol inhaler before exercise.  Dyspnea:  Uses saba before late afternoon [employer]/ eliptical otherwise has doe Cough: none SABA use: just with ex as above  rec Work on inhaler technique  12/12/2018  f/u ov/Kristi Davies re: cough variant asthma/ symb 80 2bid/ singualir rx  Chief Complaint  Patient presents with  . Follow-up    Breathing is doing well today. She was dxed with OSA and started on CPAP per Dr. Golden Hurter.  She is NOT using her albuterol inhaler and neb recently.   Dyspnea:  On feet all day / no trouble with doe  Cough: none other than occ pnds Sleeping: on cpap on bed/ bed is flat SABA use: not needing  02: none   No obvious day to day or daytime variability or assoc excess/ purulent sputum or mucus plugs or hemoptysis or cp or chest tightness, subjective wheeze or overt   hb symptoms.   Sleeping  without nocturnal  or early am exacerbation  of respiratory  c/o's or need for noct saba. Also denies any obvious fluctuation of symptoms with weather or environmental changes or other aggravating or alleviating factors except  as outlined above   No unusual exposure hx or h/o childhood pna  or knowledge of premature birth.  Current Allergies, Complete Past Medical History, Past Surgical History, Family History, and Social History were reviewed in [employer] record.  ROS  The following are not active complaints unless bolded Hoarseness, sore throat, dysphagia, dental problems, itching, sneezing,  nasal congestion or discharge of excess mucus or purulent secretions, ear ache,   fever, chills, sweats, unintended wt loss or wt gain, classically pleuritic or exertional cp,  orthopnea pnd or arm/hand swelling  or leg swelling, presyncope, palpitations, abdominal pain, anorexia, nausea, vomiting, diarrhea  or change in bowel habits or change in bladder habits, change in stools or change in urine, dysuria, hematuria,  rash, arthralgias, visual complaints, headache, numbness, weakness or ataxia or problems with walking or coordination,  change in mood or  memory.        Current Meds  Medication Sig  . albuterol (PROVENTIL HFA;VENTOLIN HFA) 108 (90 Base) MCG/ACT inhaler Inhale 1-2 puffs into the lungs every 6 (six) hours as needed for wheezing or shortness of breath.  Marland Kitchen albuterol (PROVENTIL) (2.5 MG/3ML) 0.083% nebulizer solution 1 vial in neb every 6 hours as needed  . benzonatate (TESSALON) 200 MG capsule Take 1 capsule (200 mg total) by mouth 3 (three) times daily as needed for cough.  . budesonide-formoterol (SYMBICORT) 80-4.5 MCG/ACT inhaler .Take 2 puffs first thing in am and then another 2 puffs about 12 hours later.  . clonazePAM (KLONOPIN) 0.5 MG tablet TAKE 1 TABLET BY MOUTH 2 TIMES DAILY AS NEEDED FOR ANXIETY  . cyclobenzaprine (FLEXERIL) 10 MG tablet Take 10 mg by mouth 3 (three) times daily as needed for muscle spasms.  Marland Kitchen lamoTRIgine (LAMICTAL) 150 MG tablet Take 150 mg by mouth daily.  Marland Kitchen levocetirizine (XYZAL) 5 MG tablet Take 5 mg by mouth every evening.  . montelukast (SINGULAIR) 10 MG  tablet Take 10 mg by mouth at bedtime.  Marland Kitchen UNABLE TO FIND Med Name: CPAP  . zaleplon (SONATA) 10 MG capsule Take 1 capsule (10 mg total) by mouth at bedtime as needed for sleep.              Objective:   Physical Exam    amb pleasant mod obese  wf nad   12/12/2018          182  06/10/2018          187   04/27/2018       187   03/29/18 187 lb 3.2 oz (  84.9 kg)  11/12/17 193 lb 12.8 oz (87.9 kg)     Vital signs reviewed - Note on arrival 02 sats  98% on RA     HEENT: nl dentition, turbinates bilaterally, and oropharynx. Nl external ear canals without cough reflex   NECK :  without JVD/Nodes/TM/ nl carotid upstrokes bilaterally   LUNGS: no acc muscle use,  Nl contour chest which is clear to A and P bilaterally without cough on insp or exp maneuvers   CV:  RRR  no s3 or murmur or increase in P2, and no edema   ABD:  soft and nontender with nl inspiratory excursion in the supine position. No bruits or organomegaly appreciated, bowel sounds nl  MS:  Nl gait/ ext warm without deformities, calf tenderness, cyanosis or clubbing No obvious joint restrictions   SKIN: warm and dry without lesions    NEURO:  alert, approp, nl sensorium with  no motor or cerebellar deficits apparent.       Assessment:

## 2018-12-13 ENCOUNTER — Encounter: Payer: Self-pay | Admitting: Internal Medicine

## 2018-12-14 ENCOUNTER — Encounter: Payer: Self-pay | Admitting: Internal Medicine

## 2018-12-14 NOTE — Assessment & Plan Note (Signed)
Onset ? As teenager - [name] 03/29/2018  =   11 on symb 160  - 03/29/2018  After extensive coaching inhaler device  effectiveness =    90% with spacer > try reduce symbicort to 80 2bid - Spirometry 03/29/2018  Nl with no curvature on symb 160  Sinus CT 04/04/18 > Well-aerated paranasal sinuses  Allergy profile 04/27/2018 >  Eos 0.1 /  IgE  29 RAST only pos dust/ roaches - Stop macrodantin 04/27/2018 with esr 20  rx   pred x 12 days > complete resolution of all symptoms   -12/12/2018  After extensive coaching inhaler device,  effectiveness =    90% s spacer    All goals of chronic asthma control met including optimal function and elimination of symptoms with minimal need for rescue therapy.  Contingencies discussed in full including contacting this office immediately if not controlling the symptoms using the rule of two's.     >>> f/u can be yearly, sooner if needed    I had an extended discussion with the patient reviewing all relevant studies completed to date and  lasting 10 minutes of a 15 minute office visit    See device teaching which extended face to face time for this visit.  Each maintenance medication was reviewed in detail including emphasizing most importantly the difference between maintenance and prns and under what circumstances the prns are to be triggered using an action plan format that is not reflected in the computer generated alphabetically organized AVS which I have not found useful in most complex patients, especially with respiratory illnesses  Please see AVS for specific instructions unique to this visit that I personally wrote and verbalized to the the pt in detail and then reviewed with pt  by my nurse highlighting any  changes in therapy recommended at today's visit to their plan of care.

## 2019-01-30 NOTE — Progress Notes (Signed)
[facility]  PATIENT: Kristi Davies DOB: 09-23-1976   REASON FOR VISIT:Follow-up for obstructive sleep apnea here for initial CPAP HISTORY FROM: Patient    HISTORY OF PRESENT ILLNESS:UPDATE 2/25/2020CM Kristi Davies, 43 year old female returns for follow-up with newly diagnosed with moderate obstructive sleep apnea  here for initial CPAP.She takes continues to complain with significant sleepiness, ESS 15.  She also has a history of depression currently on Lamictal Flexeril and Klonopin.  To help her sleep at night she is on trazodone and Sonata.  CPAP data dated 12/31/2018-01/29/2019 shows compliance greater than 4 hours at 83%.  Average usage 6 hours 33 minutes set pressure 5 to 15 cm AHI 2.2.  No leak her mask  nasal pillows.  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  She returns for reevaluation    10/16/2019CDCarrie Davies is a 43 y.o. female patient seen here on 09-21-2018   in a referral from NP Jones for a sleep apnea evaluation , evaluation of excessive daytime sleepiness.  Mrs. Kristi Davies has suffered from excessive daytime sleepiness and a high degree of fatigue for several months, she was evaluated by pulmonology for a concern of chronic cough, she has asthma- she has been evaluated for polycystic ovarian syndrome and is status post ablation with amenorrhea, been followed by Dr. Elvera Lennox .  In addition Dr. York Ram has worked her up for chest pain from a cardiology standpoint.  A prolonged Holter monitor study did not reveal any cardiac abnormalities.  I appreciate that the patient brought me her sleep diary that shows sats from the beginning of the year she had ongoing difficulties initiating sleep earlier than midnight usually her bedtime and sleep time can be as late as 11:30 AM the average seems to be around 8 AM.  There is however a high degree of variability she will stay asleep some days 7 hours, and other days not even 1 hour.  Average  sleep time however seems to be around 7 hours and last until 2:00 PM. She seems to have a delayed sleep phase.   Chief complaint according to patient :  "I drug myself to sleep- in spite of black out curtain, medication, and a quiet , cool, and dark environment.- then  I pay the price of oversleeping " and need caffeine.  .   Sleep habits are as follows: I appreciate that the patient brought me her sleep diary that shows sats from the beginning of the year she had ongoing difficulties initiating sleep earlier than midnight usually her bedtime and sleep time can be as late as 11:30 AM the average seems to be around 8 AM.  There is however a high degree of variability she will stay asleep some days 7 hours, and other days not even 1 hour.  Average sleep time however seems to be around 7 hours and last until 2:00 PM. She seems to have a delayed sleep phase.  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 She takes cat naps 30 minutes or less, without dreaming .  Her husband gets home working first in the next 40 at 9 Pm and they have dinner between 9 and 10, she tends not to eat up to midnight when retreating to bed , in a cool, quiet an dark room. She takes a hot shower, her dog stays in the bedroom, she may stretch , and she tries to meditate-  Yoga. She often is not  asleep but bored- and audio-books stimulate her brain.  She avoids sounds. She may finally sleep at any time between 1- 8 AM-      REVIEW OF SYSTEMS: Full 14 system review of systems performed and notable only for those listed, all others are neg:  Constitutional: neg  Cardiovascular: neg Ear/Nose/Throat: neg  Skin: neg Eyes: neg Respiratory: neg Gastroitestinal: neg  Hematology/Lymphatic: neg  Endocrine: neg Musculoskeletal:neg Allergy/Immunology: neg Neurological: neg Psychiatric: Depression followed by Kristi Davies Sleep : Obstructive sleep apnea with CPAP, night shift  worker   ALLERGIES: Allergies  Allergen Reactions  . Shrimp [Shellfish Allergy] Anaphylaxis  . Latex Itching  . Levaquin [Levofloxacin In D5w] Hives  . Levofloxacin Hives  . Sulfa Antibiotics Rash    HOME MEDICATIONS: Outpatient Medications Prior to Visit  Medication Sig Dispense Refill  . albuterol (PROVENTIL HFA;VENTOLIN HFA) 108 (90 Base) MCG/ACT inhaler Inhale 1-2 puffs into the lungs every 6 (six) hours as needed for wheezing or shortness of breath.    Marland Kitchen albuterol (PROVENTIL) (2.5 MG/3ML) 0.083% nebulizer solution 1 vial in neb every 6 hours as needed  1  . benzonatate (TESSALON) 200 MG capsule Take 1 capsule (200 mg total) by mouth 3 (three) times daily as needed for cough. 45 capsule 2  . budesonide-formoterol (SYMBICORT) 80-4.5 MCG/ACT inhaler .Take 2 puffs first thing in am and then another 2 puffs about 12 hours later. 1 Inhaler 12  . clonazePAM (KLONOPIN) 0.5 MG tablet TAKE 1 TABLET BY MOUTH 2 TIMES DAILY AS NEEDED FOR ANXIETY  0  . cyclobenzaprine (FLEXERIL) 10 MG tablet Take 10 mg by mouth 3 (three) times daily as needed for muscle spasms.    Marland Kitchen lamoTRIgine (LAMICTAL) 150 MG tablet Take 150 mg by mouth daily.    Marland Kitchen levocetirizine (XYZAL) 5 MG tablet Take 5 mg by mouth every evening.    . montelukast (SINGULAIR) 10 MG tablet Take 10 mg by mouth at bedtime.    . traZODone (DESYREL) 100 MG tablet Take 100 mg by mouth at bedtime.    Marland Kitchen UNABLE TO FIND Med Name: CPAP    . zaleplon (SONATA) 10 MG capsule Take 1 capsule (10 mg total) by mouth at bedtime as needed for sleep. 30 capsule 5   No facility-administered medications prior to visit.     PAST MEDICAL HISTORY: Past Medical History:  Diagnosis Date  . Allergy   . Asthma   . Bipolar disorder (HCC)    Type 2  . Depression   . Headache    Migraines  . IBS (irritable bowel syndrome)   . Insomnia   . PCOS (polycystic ovarian syndrome)   . Pneumonia    history of  . Vitamin D deficiency     PAST SURGICAL  HISTORY: Past Surgical History:  Procedure Laterality Date  . BLADDER SUSPENSION N/A 08/30/2013   Procedure: TRANSVAGINAL TAPE (TVT) PROCEDURE;  Surgeon: Lavina Hamman, MD;  Location: [facility];  Service: Gynecology;  Laterality: N/A;  . EYE SURGERY    . HYSTEROSCOPY WITH NOVASURE N/A 08/30/2013   Procedure: HYSTEROSCOPY WITH NOVASURE;  Surgeon: Lavina Hamman, MD;  Location: [facility];  Service: Gynecology;  Laterality: N/A;  1 1/2 hrs OR time  . LAPAROSCOPIC TUBAL LIGATION Bilateral 11/20/2015   Procedure: LAPAROSCOPIC TUBAL LIGATION;  Surgeon: Lavina Hamman, MD;  Location: [facility];  Service: Gynecology;  Laterality: Bilateral;  . SALPINGECTOMY    . wisdom tooth extraction      FAMILY HISTORY: Family History  Problem Relation Age  of Onset  . Depression Mother   . Diabetes Mother   . Asthma Mother   . Migraines Mother   . Hypertension Mother   . Diabetes Father   . Heart disease Father   . Hypertension Father   . Dementia Maternal Grandmother   . Breast cancer Maternal Grandmother   . Heart disease Maternal Grandfather   . Dementia Paternal Grandmother   . Stroke Paternal Grandmother   . Arthritis Paternal Grandfather   . Heart disease Paternal Grandfather     SOCIAL HISTORY: Social History   Socioeconomic History  . Marital status: Married    Spouse name: Not on file  . Number of children: Not on file  . Years of education: Not on file  . Highest education level: Not on file  Occupational History  . Not on file  Social Needs  . Financial resource strain: Not on file  . Food insecurity:    Worry: Not on file    Inability: Not on file  . Transportation needs:    Medical: Not on file    Non-medical: Not on file  Tobacco Use  . Smoking status: Never Smoker  . Smokeless tobacco: Never Used  Substance and Sexual Activity  . Alcohol use: Yes    Alcohol/week: 1.0 standard drinks    Types: 1 Cans of beer per week  . Drug use: No  . Sexual activity: Yes    Birth  control/protection: Other-see comments    Comment: vasectomy  Lifestyle  . Physical activity:    Days per week: Not on file    Minutes per session: Not on file  . Stress: Not on file  Relationships  . Social connections:    Talks on phone: Not on file    Gets together: Not on file    Attends religious service: Not on file    Active member of club or organization: Not on file    Attends meetings of clubs or organizations: Not on file    Relationship status: Not on file  . Intimate partner violence:    Fear of current or ex partner: Not on file    Emotionally abused: Not on file    Physically abused: Not on file    Forced sexual activity: Not on file  Other Topics Concern  . Not on file  Social History Narrative   Single   Self-employed   1 daughter   Exercise: yes   Caffeine use: yes     PHYSICAL EXAM  Vitals:   01/31/19 0827  BP: 136/87  Pulse: 87  Weight: 191 lb 9.6 oz (86.9 kg)  Height:  (1.6 m)   Body mass index is 33.94 kg/m.  Generalized: Well developed, obese female in no acute distress  Head: normocephalic and atraumatic,. Oropharynx benign mallopatti 4 Neck: Supple, circumference 15 Lungs clear Musculoskeletal: No deformity  Skin no rash or edema Neurological examination   Mentation: Alert oriented to time, place, history taking. Attention span and concentration appropriate. Recent and remote memory intact.  Follows all commands speech and language fluent.   Cranial nerve II-XII: Pupils were equal round reactive to light extraocular movements were full, visual field were full on confrontational test. Facial sensation and strength were normal. hearing was intact to finger rubbing bilaterally. Uvula tongue midline. head turning and shoulder shrug were normal and symmetric.Tongue protrusion into cheek strength was normal. Motor: normal bulk and tone, full strength in the BUE, BLE,  Sensory: normal and symmetric to light  touch,   Coordination:  finger-nose-finger, heel-to-shin bilaterally, no dysmetria Gait and Station: Rising up from seated position without assistance, normal stance,  moderate stride, good arm swing, smooth turning, able to perform tiptoe, and heel walking without difficulty. Tandem gait is steady  DIAGNOSTIC DATA (LABS, IMAGING, TESTING) - I reviewed patient records, labs, notes, testing and imaging myself where available.  Lab Results  Component Value Date   WBC 8.2 04/27/2018   HGB 13.6 04/27/2018   HCT 40.6 04/27/2018   MCV 88.7 04/27/2018   PLT 281.0 04/27/2018      Component Value Date/Time   NA 138 11/28/2016 1958   K 4.3 11/28/2016 1958   CL 101 11/28/2016 1958   CO2 25 11/28/2016 1958   GLUCOSE 108 (H) 11/28/2016 1958   BUN 19 11/28/2016 1958   CREATININE 0.86 11/28/2016 1958   CREATININE 0.85 11/12/2016 1333   CALCIUM 9.9 11/28/2016 1958   PROT 7.0 08/30/2013 0615   ALBUMIN 3.2 (L) 08/30/2013 0615   AST 15 08/30/2013 0615   ALT 11 08/30/2013 0615   ALKPHOS 58 08/30/2013 0615   BILITOT 0.3 08/30/2013 0615   GFRNONAA >60 11/28/2016 1958   GFRNONAA 87 11/12/2016 1333   GFRAA >60 11/28/2016 1958   GFRAA >89 11/12/2016 1333    Lab Results  Component Value Date   HGBA1C 5.7 05/13/2017   Lab Results  Component Value Date   VITAMINB12 364 11/12/2016   Lab Results  Component Value Date   TSH 1.02 11/12/2016      ASSESSMENT AND PLAN  43 y.o. year old female  has a past medical history of Allergy, Asthma, Bipolar disorder (HCC), Depression, Headache, IBS (irritable bowel syndrome), Insomnia, PCOS (polycystic ovarian syndrome), Pneumonia, and Vitamin D deficiency. here to follow-up for newly diagnosed obstructive sleep apnea here for initial CPAP.Data dated 12/31/2018-01/29/2019 shows compliance greater than 4 hours at 83%.  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  Dr. Vickey Huger has said in the past this is very treatment resistant.  This disorder is generally followed by a tertiary care center and psychiatry such as [facility].   Discussed with Dr. Vickey Huger Will check blood test for narcolepsy due to continued sleepiness Suggest she stop Flexeril CPAP compliance 83.3%  F/U in 2 months I spent 25 minutes in total face to face time with the patient more than 50% of which was spent counseling and coordination of care, reviewing test results reviewing medications and discussing and reviewing the diagnosis of obstructive sleep apnea and circadian rhythm disorder and further treatment options. ,. Nilda Riggs, [facility], [facility], APRN  [facility] [address], [address] [place], [place] [address] [phone]

## 2019-01-31 ENCOUNTER — Ambulatory Visit: Payer: 59 | Admitting: Nurse Practitioner

## 2019-01-31 ENCOUNTER — Encounter: Payer: Self-pay | Admitting: Nurse Practitioner

## 2019-01-31 VITALS — BP 136/87 | HR 87 | Ht 63.0 in | Wt 191.6 lb

## 2019-01-31 DIAGNOSIS — G4720 Circadian rhythm sleep disorder, unspecified type: Secondary | ICD-10-CM | POA: Diagnosis not present

## 2019-01-31 DIAGNOSIS — G4733 Obstructive sleep apnea (adult) (pediatric): Secondary | ICD-10-CM | POA: Diagnosis not present

## 2019-01-31 DIAGNOSIS — R5383 Other fatigue: Secondary | ICD-10-CM | POA: Diagnosis not present

## 2019-01-31 NOTE — Patient Instructions (Signed)
Will check blood test for narcolepsy due to continued sleepiness Suggest she stop Flexeril CPAP compliance 83.3%  F/U in 2 months

## 2019-02-09 ENCOUNTER — Telehealth: Payer: Self-pay | Admitting: *Deleted

## 2019-02-09 ENCOUNTER — Encounter: Payer: Self-pay | Admitting: Neurology

## 2019-02-09 LAB — NARCOLEPSY EVALUATION
DQA1*01:02: NEGATIVE
DQB1*06:02: NEGATIVE

## 2019-02-09 NOTE — Telephone Encounter (Signed)
-----   Message from Kristi Carolyn Martin, NP sent at 02/09/2019  4:03 PM EST ----- Test for narcolepsy was neg.Dr. Dohmeier advises more sunlight daily or a light box for exposure. Avoid meds that cause fatigue. 

## 2019-02-09 NOTE — Telephone Encounter (Signed)
LMVM for pt on mobile that her lab test result for narcolepsy came back negative.  Recommended more daily sunlight, or light box for exposure, and also to avoid medications that cause fatigue.  She is to call back if questions.

## 2019-02-09 NOTE — Telephone Encounter (Signed)
-----   Message from Nilda Riggs, NP sent at 02/09/2019  4:03 PM EST ----- Test for narcolepsy was neg.Dr. Vickey Huger advises more sunlight daily or a light box for exposure. Avoid meds that cause fatigue.

## 2019-02-10 NOTE — Telephone Encounter (Signed)
There is Ritalin as a non -amphetamine drug.  Many other non- amphetaminergic stimulants can only be prescribed with co- factors in place ( dx of narcolepsy, OSA and SHIFTWORK ). Modafinil is in that category, another option I will be looking into is SUNOSI- but no generic is yet available.  I will contact you when I have an answer in regards to coverage for the named medications. CD

## 2019-02-13 MED ORDER — MODAFINIL 200 MG PO TABS: ORAL_TABLET | ORAL | 5 refills | Status: DC

## 2019-02-13 NOTE — Addendum Note (Signed)
Addended by: Judi Cong on: 02/13/2019 01:40 PM   Modules accepted: Orders

## 2019-03-03 NOTE — Telephone Encounter (Signed)
Patient has been told to self quarentine for 7 days because she has a dry cough, low grade fever, chest pressure, and some trouble breathing.  Patient wants to know if there is anything extra she should look for or be concerned about since she has asthma.  Dr. Sherene Sires please advise

## 2019-03-14 ENCOUNTER — Telehealth: Payer: Self-pay | Admitting: Internal Medicine

## 2019-03-14 NOTE — Telephone Encounter (Signed)
lmom to call back 

## 2019-03-14 NOTE — Telephone Encounter (Signed)
Patient returning phone cal.  Patient phone number is [phone].

## 2019-03-14 NOTE — Telephone Encounter (Signed)
Returned phone call to patient, she states she was recently seen by urgent care and dx with bronchitis caused by a viral infection. She reports having an intermittent fever with the highest being 100.4. She is currently in quarantine. She was not tested for COVID-19. She states she was only given cough syrup by urgent care but she is becoming more concerned about her wheezing and increasing SOB.  She reports having intermittent chest tightness and chest pain. She states she has been having chest pain for last 3 weeks and has been seen by urgent care 3 times. She works at a [employer]. She denies any recent travel or having been around anyone sick. States her cough is chronic and when she does cough anything up it is white in appearance. She also mentioned having increased fatigue. She states her symptoms don't seem to be improving so she is requesting a tele visit. Appt made. Made patient aware if she is having and/or continues to have active chest pain she needs to present to ER. Voiced understanding. Nothing further is needed at this time.

## 2019-03-15 ENCOUNTER — Ambulatory Visit (INDEPENDENT_AMBULATORY_CARE_PROVIDER_SITE_OTHER): Payer: 59 | Admitting: Primary Care

## 2019-03-15 ENCOUNTER — Encounter: Payer: Self-pay | Admitting: Primary Care

## 2019-03-15 ENCOUNTER — Encounter: Payer: Self-pay | Admitting: Neurology

## 2019-03-15 ENCOUNTER — Other Ambulatory Visit: Payer: Self-pay

## 2019-03-15 DIAGNOSIS — R6889 Other general symptoms and signs: Secondary | ICD-10-CM

## 2019-03-15 DIAGNOSIS — J45991 Cough variant asthma: Secondary | ICD-10-CM | POA: Diagnosis not present

## 2019-03-15 MED ORDER — AZITHROMYCIN 250 MG PO TABS: ORAL_TABLET | ORAL | 0 refills | Status: DC

## 2019-03-15 MED ORDER — GUAIFENESIN-CODEINE 100-10 MG/5ML PO SOLN: 5.0000 mL | Freq: Four times a day (QID) | ORAL | 0 refills | Status: DC | PRN

## 2019-03-15 NOTE — Progress Notes (Addendum)
Virtual Visit via Telephone Note  I connected with Kristi Davies on 03/15/19 at  2:00 PM EDT by telephone and verified that I am speaking with the correct person using two identifiers.   I discussed the limitations, risks, security and privacy concerns of performing an evaluation and management service by telephone and the availability of in person appointments. I also discussed with the patient that there may be a patient responsible charge related to this service. The patient expressed understanding and agreed to proceed.   History of Present Illness: 43 year old female, never smoked. PMH significant for cough variant asthma vs UACS, OSA. Patient of Dr. Sherene Sires, last seen 12/12/18. Recently seen at [facility] for bronchitis symptoms. Temp 100.4. Given depo-medrol injection and RX for steroid dose pack and cough medication. Concerned with wheezing and increased shortness of breath. Symptoms started 19 days ago. Started with HA, lethargy, dry cough, sob and chest tightness. Low grade temp. Lost sense of smell. No sinus symptoms. She has been taking Zysal d/t pollen. No symptoms have gone away. Cough has worsened. CXR on 03/12/19 showed lung clears. She has been using Symbicort twice daily as prescribed. O2 level today 95% RA. Temp 98.8. Husband is an EMT. Denies sinus symptoms.   Observations/Objective: Observations: - No obvious shortness of breath, wheezing or cough during phone conversation - Patient reported O2 level 95% RA during conversation  - Temp 98.8 today (99.5 last night)  Testing reviewed: - Spirometry 03/29/2018  Nl with no curvature on symb 160  - Sinus CT 04/04/18 > Well-aerated paranasal sinuses  - Allergy profile 04/27/2018 >  Eos 0.1 /  IgE  29 RAST only pos dust/ roaches  Assessment and Plan:  Flu like illness: - Low-Moderate suspicion for COVID-19, symptoms are stable and patient is able to stay home  - Monitor temperature every 4-6 hours, notify if > 100.5 and not responding to  tylenol - Take tylenol 650mg  every 4 hours as needed for fever/chills  - Stay well hydrated! (aim eight 8oz glasses of fluid a day) - Advise you stay home until free from fever for 3 straight days (<100.4) and free from respiratory symptoms for 7 days  - Note provided to be out of work until 4/22 - If your fever worsens, shortness of breath worsen or O2<90% go to ED   Cough/bronchitis: - Try taking Chlorpheniramine (Chlor-a-tabs) 4mg  every 4-6 hours for COUGH - RX refill guaifenesin- codeine (PMP checked, score 270 ) - CXR showed clear lungs on 03/12/19 - Reasonable to send in zpack d/t low grade temp and persistent cough symptoms   Shortness of breath: - Continue prednisone taper as prescribed by Urgent care  - Continue Symbicort, two puffs twice day - Use Albuterol rescue inhaler every 4-6 hours as needed for shortness of breath/wheezing   Follow Up Instructions:  As needed if symptoms worsen    I discussed the assessment and treatment plan with the patient. The patient was provided an opportunity to ask questions and all were answered. The patient agreed with the plan and demonstrated an understanding of the instructions.   The patient was advised to call back or seek an in-person evaluation if the symptoms worsen or if the condition fails to improve as anticipated.  I provided 26 minutes of non-face-to-face time during this encounter.   Glenford Bayley, NP

## 2019-03-15 NOTE — Progress Notes (Signed)
Chart and office note reviewed in detail  > agree with a/p as outlined    

## 2019-03-15 NOTE — Addendum Note (Signed)
Addended by: Glenford Bayley on: 03/15/2019 03:41 PM   Modules accepted: Orders

## 2019-03-15 NOTE — Patient Instructions (Addendum)
Testing: - CXR showed clear lungs on 03/12/19  Cough: - Try taking Chlorpheniramine (Chlor-a-tabs) 4mg  every 4-6 hours for COUGH - You can get this over the counter at [facility], may cause some drowsiness  - RX refill cough syrup; do not drive or combine with other medication or drink alcohol while taking  Monitor temperature every 4-6 hours - Take tylenol 650mg  every 4 hours as needed for fever/chills  - Notify if > 100.5 and not responding to tylenol  Shortness of breath: - Continue prednisone taper as prescribed by Urgent care  - Continue Symbicort, two puffs twice day - Use Albuterol rescue inhaler every 4-6 hours as needed for shortness of breath/wheezing - If rescue inhaler not helpful, try using nebulizer   Recommendations: - Stay well hydrated!!!! (aim eight 8oz glasses of fluid a day) - If your fever worsens, shortness of breath worsen or O2<90% go to ED  - I advise you stay home until free from fever for 3 straight days (<100.4) and free from respiratory symptoms for 7 days     Coronavirus (COVID-19) Are you at risk?  Are you at risk for the Coronavirus (COVID-19)?  To be considered HIGH RISK for Coronavirus (COVID-19), you have to meet the following criteria:  . Traveled to [place], [place], [place], [place] or [place]; or in the [place] to [place], [place], [place], or [place]; and have fever, cough, and shortness of breath within the last 2 weeks of travel OR . Been in close contact with a person diagnosed with COVID-19 within the last 2 weeks and have fever, cough, and shortness of breath . IF YOU DO NOT MEET THESE CRITERIA, YOU ARE CONSIDERED LOW RISK FOR COVID-19.  What to do if you are HIGH RISK for COVID-19?  Marland Kitchen. If you are having a medical emergency, call 911. . Seek medical care right away. Before you go to a doctor's office, urgent care or emergency department, call ahead and tell them about your recent travel, contact with someone diagnosed with  COVID-19, and your symptoms. You should receive instructions from your physician's office regarding next steps of care.  . When you arrive at healthcare provider, tell the healthcare staff immediately you have returned from visiting [place], [place], [place], [place] or [place]; or traveled in the [place] to [place], [place], [place], or [place]; in the last two weeks or you have been in close contact with a person diagnosed with COVID-19 in the last 2 weeks.   . Tell the health care staff about your symptoms: fever, cough and shortness of breath. . After you have been seen by a medical provider, you will be either: o Tested for (COVID-19) and discharged home on quarantine except to seek medical care if symptoms worsen, and asked to  - Stay home and avoid contact with others until you get your results (4-5 days)  - Avoid travel on public transportation if possible (such as bus, train, or airplane) or o Sent to the Emergency Department by EMS for evaluation, COVID-19 testing, and possible admission depending on your condition and test results.  What to do if you are LOW RISK for COVID-19?  Reduce your risk of any infection by using the same precautions used for avoiding the common cold or flu:  Marland Kitchen. Wash your hands often with soap and warm water for at least 20 seconds.  If soap and water are not readily available, use an alcohol-based hand sanitizer with at least 60% alcohol.  .Marland Kitchen  If coughing or sneezing, cover your mouth and nose by coughing or sneezing into the elbow areas of your shirt or coat, into a tissue or into your sleeve (not your hands). . Avoid shaking hands with others and consider head nods or verbal greetings only. . Avoid touching your eyes, nose, or mouth with unwashed hands.  . Avoid close contact with people who are sick. . Avoid places or events with large numbers of people in one location, like concerts or sporting events. . Carefully consider travel plans you have or  are making. . If you are planning any travel outside or inside the [place], visit the CDC's Travelers' Health webpage for the latest health notices. . If you have some symptoms but not all symptoms, continue to monitor at home and seek medical attention if your symptoms worsen. . If you are having a medical emergency, call 911.   ADDITIONAL HEALTHCARE OPTIONS FOR PATIENTS  [facility] / e-Visit: [email or web address]         [facility]: [phone]  [facility]: [phone]                   [facility]: [phone]    Asthma, Adult  Asthma is a long-term (chronic) condition in which the airways get tight and narrow. The airways are the breathing passages that lead from the nose and mouth down into the lungs. A person with asthma will have times when symptoms get worse. These are called asthma attacks. They can cause coughing, whistling sounds when you breathe (wheezing), shortness of breath, and chest pain. They can make it hard to breathe. There is no cure for asthma, but medicines and lifestyle changes can help control it. There are many things that can bring on an asthma attack or make asthma symptoms worse (triggers). Common triggers include:  Mold.  Dust.  Cigarette smoke.  Cockroaches.  Things that can cause allergy symptoms (allergens). These include animal skin flakes (dander) and pollen from trees or grass.  Things that pollute the air. These may include household cleaners, wood smoke, smog, or chemical odors.  Cold air, weather changes, and wind.  Crying or laughing hard.  Stress.  Certain medicines or drugs.  Certain foods such as dried fruit, potato chips, and grape juice.  Infections, such as a cold or the flu.  Certain medical conditions or diseases.  Exercise or tiring activities. Asthma may be treated with medicines and by staying away from the things that cause  asthma attacks. Types of medicines may include:  Controller medicines. These help prevent asthma symptoms. They are usually taken every day.  Fast-acting reliever or rescue medicines. These quickly relieve asthma symptoms. They are used as needed and provide short-term relief.  Allergy medicines if your attacks are brought on by allergens.  Medicines to help control the body's defense (immune) system. Follow these instructions at home: Avoiding triggers in your home  Change your heating and air conditioning filter often.  Limit your use of fireplaces and wood stoves.  Get rid of pests (such as roaches and mice) and their droppings.  Throw away plants if you see mold on them.  Clean your floors. Dust regularly. Use cleaning products that do not smell.  Have someone vacuum when you are not home. Use a vacuum cleaner with a HEPA filter if possible.  Replace carpet with wood, tile, or vinyl flooring. Carpet can trap animal skin flakes and dust.  Use allergy-proof pillows, mattress covers,  and box spring covers.  Wash bed sheets and blankets every week in hot water. Dry them in a dryer.  Keep your bedroom free of any triggers.  Avoid pets and keep windows closed when things that cause allergy symptoms are in the air.  Use blankets that are made of polyester or cotton.  Clean bathrooms and kitchens with bleach. If possible, have someone repaint the walls in these rooms with mold-resistant paint. Keep out of the rooms that are being cleaned and painted.  Wash your hands often with soap and water. If soap and water are not available, use hand sanitizer.  Do not allow anyone to smoke in your home. General instructions  Take over-the-counter and prescription medicines only as told by your doctor. ? Talk with your doctor if you have questions about how or when to take your medicines. ? Make note if you need to use your medicines more often than usual.  Do not use any products  that contain nicotine or tobacco, such as cigarettes and e-cigarettes. If you need help quitting, ask your doctor.  Stay away from secondhand smoke.  Avoid doing things outdoors when allergen counts are high and when air quality is low.  Wear a ski mask when doing outdoor activities in the winter. The mask should cover your nose and mouth. Exercise indoors on cold days if you can.  Warm up before you exercise. Take time to cool down after exercise.  Use a peak flow meter as told by your doctor. A peak flow meter is a tool that measures how well the lungs are working.  Keep track of the peak flow meter's readings. Write them down.  Follow your asthma action plan. This is a written plan for taking care of your asthma and treating your attacks.  Make sure you get all the shots (vaccines) that your doctor recommends. Ask your doctor about a flu shot and a pneumonia shot.  Keep all follow-up visits as told by your doctor. This is important. Contact a doctor if:  You have wheezing, shortness of breath, or a cough even while taking medicine to prevent attacks.  The mucus you cough up (sputum) is thicker than usual.  The mucus you cough up changes from clear or white to yellow, green, gray, or bloody.  You have problems from the medicine you are taking, such as: ? A rash. ? Itching. ? Swelling. ? Trouble breathing.  You need reliever medicines more than 2-3 times a week.  Your peak flow reading is still at 50-79% of your personal best after following the action plan for 1 hour.  You have a fever. Get help right away if:  You seem to be worse and are not responding to medicine during an asthma attack.  You are short of breath even at rest.  You get short of breath when doing very little activity.  You have trouble eating, drinking, or talking.  You have chest pain or tightness.  You have a fast heartbeat.  Your lips or fingernails start to turn blue.  You are light-headed  or dizzy, or you faint.  Your peak flow is less than 50% of your personal best.  You feel too tired to breathe normally. Summary  Asthma is a long-term (chronic) condition in which the airways get tight and narrow. An asthma attack can make it hard to breathe.  Asthma cannot be cured, but medicines and lifestyle changes can help control it.  Make sure you understand how to avoid  triggers and how and when to use your medicines. This information is not intended to replace advice given to you by your health care provider. Make sure you discuss any questions you have with your health care provider. Document Released: 05/11/2008 Document Revised: 12/28/2016 Document Reviewed: 12/28/2016 Elsevier Interactive Patient Education  2019 [employer].

## 2019-03-16 ENCOUNTER — Telehealth: Payer: Self-pay | Admitting: Neurology

## 2019-03-16 ENCOUNTER — Encounter: Payer: Self-pay | Admitting: Neurology

## 2019-03-16 NOTE — Telephone Encounter (Signed)
Due to current COVID 19 pandemic, our office is severely reducing in office visits until further notice, in order to minimize the risk to our patients and healthcare providers.   Called patient because she was on the schedule to see [facility] NP, who is currently on maternity leave. I offered patient a sooner appointment with Dr. Vickey Huger via a virtual visit, to which patient agreed. I explained to patient the steps needed for setting up the webex meeting on her device. Patient verbalized understanding. I advised patient that she will receive an e-mail with the link needed for connection, as well as phone calls from our office to go over medical history and to confirm insurance info. Patient agreed.  Pt understands that although there may be some limitations with this type of visit, we will take all precautions to reduce any security or privacy concerns.  Pt understands that this will be treated like an in office visit and we will file with pt's insurance, and there may be a patient responsible charge related to this service.  Pt's email is [email or web address]. Pt understands that the [employer] webex software must be downloaded and operational on the device pt plans to use for the visit.

## 2019-03-16 NOTE — Telephone Encounter (Signed)
Called the patient to review the patient's chart. Her chart seems to have been recently updated on 03/15/19. I will mychart message and make sure everything is up to date.

## 2019-03-23 ENCOUNTER — Ambulatory Visit (INDEPENDENT_AMBULATORY_CARE_PROVIDER_SITE_OTHER): Payer: 59 | Admitting: Neurology

## 2019-03-23 ENCOUNTER — Other Ambulatory Visit: Payer: Self-pay

## 2019-03-23 ENCOUNTER — Encounter: Payer: Self-pay | Admitting: Neurology

## 2019-03-23 DIAGNOSIS — R0689 Other abnormalities of breathing: Secondary | ICD-10-CM | POA: Diagnosis not present

## 2019-03-23 DIAGNOSIS — Z9989 Dependence on other enabling machines and devices: Secondary | ICD-10-CM

## 2019-03-23 DIAGNOSIS — G4733 Obstructive sleep apnea (adult) (pediatric): Secondary | ICD-10-CM

## 2019-03-23 DIAGNOSIS — R5383 Other fatigue: Secondary | ICD-10-CM | POA: Diagnosis not present

## 2019-03-23 DIAGNOSIS — R053 Chronic cough: Secondary | ICD-10-CM | POA: Insufficient documentation

## 2019-03-23 DIAGNOSIS — R509 Fever, unspecified: Secondary | ICD-10-CM | POA: Diagnosis not present

## 2019-03-23 DIAGNOSIS — R05 Cough: Secondary | ICD-10-CM

## 2019-03-23 NOTE — Progress Notes (Signed)
SLEEP MEDICINE CLINIC   Provider:  Melvyn Novas, M.D.  Referring Provider: Iona Hansen, NP   This patient is established with Dr. Sherene Sires, Dr Elvera Lennox , Dr Kizzie Bane, and Dr. Allyson Sabal;  Virtual Visit via Video Note  I connected with Kristi Davies on 03/23/19 at  3:00 PM EDT by a video enabled telemedicine application and verified that I am speaking with the correct person using two identifiers.   I discussed the limitations of evaluation and management by telemedicine and the availability of in person appointments. The patient expressed understanding and agreed to proceed.   Melvyn Novas, MD   03-23-2019, VIDEO-  Kristi Davies is a 43 y.o. female patient , she is currently in quarantine due to CORVID 19 exposure and symptoms. She reports that the CPAP she has just been issued and moisture and pressure have helped symptoms of chest tightness. She has still a feeling of not being able to breathe deeply. The symptoms peaked about a week ago. Her husband was also sick in early March, his leg had a staph infection. He is an EMT.  She has felt good on CPAP, but has trouble to get 4 hours of use. 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  Sleep time is between 7.30 AM and 4 PM,  Currently not in class / virtual.  She is using codeine cough syrup. I encouraged her to use it regularly.   Download AHI 3.4/h, 27/30 days of use, average 4. 16 minutes, compliance 56.7% with better quality of sleep.  Fatigue may be related to Covid 19  .     Was last seen here on 09-21-2018 in a referral from NP Jones for a sleep apnea evaluation , evaluation of excessive daytime sleepiness.  Kristi Davies has suffered from excessive daytime sleepiness and a high degree of fatigue for several months, she was evaluated by pulmonology for a concern of chronic cough, she has asthma- she has been evaluated for polycystic ovarian syndrome and is status post ablation with amenorrhea, been followed by Dr.  Elvera Lennox .  In addition Dr. York Ram has worked her up for chest pain from a cardiology standpoint.  A prolonged Holter monitor study did not reveal any cardiac abnormalities. 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  She is very concerned because she would like to go to bed earlier successfully initiate sleep and stay asleep helping her to keep her regular office drop in daytime.  She has in the past tried to document the room and get ready for bedtime earlier but has not felt that it helped her to initiate sleep.  She has been able to sleep somewhat this trazodone but only 50 mg which is a reasonable dose however she felt that she was groggy the following day.  Lamictal seems not to have influenced her sleep cycle very much, Klonopin is not frequently used and was not prescribed primarily to induce sleep, the same is true for Flexeril.  She also takes [employer] as needed but has not noticed that it influences her sleep pattern greatly.  She does not usually drink caffeine late in the day knowing that it could affect her sleep latency.  She had extensive lab work just recently done which I reviewed and it looks really all normal.  She has tried and slept well under Seroquel, but she gained too much weight. Remus Loffler helped temporarily , immediate and XR form. 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 Has failed resetting rhythms after not sleeping for 36 hours. Remeron did not work, doxepin did cause bronchitis.    I appreciate that the patient brought me her sleep diary that shows sats from the beginning of the year she had ongoing difficulties initiating sleep earlier than midnight usually her bedtime and sleep time can be as late as 11:30 AM the average seems to be around 8 AM.  There is however a high degree of variability she will stay asleep some days 7 hours, and other days not even 1 hour.  Average sleep time however seems to be  around 7 hours and last until 2:00 PM. She seems to have a delayed sleep phase.   Chief complaint according to patient :  "I drug myself to sleep- in spite of black out curtain, medication, and a quiet , cool, and dark environment.- then  I pay the price of oversleeping " and need caffeine.  .   Sleep habits are as follows: I appreciate that the patient brought me her sleep diary that shows sats from the beginning of the year she had ongoing difficulties initiating sleep earlier than midnight usually her bedtime and sleep time can be as late as 11:30 AM the average seems to be around 8 AM.  There is however a high degree of variability she will stay asleep some days 7 hours, and other days not even 1 hour.  Average sleep time however seems to be around 7 hours and last until 2:00 PM. She seems to have a delayed sleep phase.  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 She takes cat naps 30 minutes or less, without dreaming .  Her husband gets home working first in the next 40 at 9 Pm and they have dinner between 9 and 10, she tends not to eat up to midnight when retreating to bed , in a cool, quiet an dark room. She takes a hot shower, her dog stays in the bedroom, she may stretch , and she tries to meditate-  Yoga. She often is not asleep but bored- and audio-books stimulate her brain.  She avoids sounds. She may finally sleep at any time between 1- 8 AM-     Sleep medical history and family sleep history: her daughter has at age 44 a circadian rhythm disorder- and her mother has similar complaint. GERD .   Social history:  Works at a Scientist, research (physical sciences).  In school, 3.5 grade point average.   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 Caffeine 2 in AM, soda rarely, non smoker- asthma- ETOH occassionally ,     Review of Systems: Out of a complete 14 system review, the patient  complains of only the following symptoms, and all other reviewed systems are negative.  No snoring, no apnea. Cyclic insomnia,  Bipolar depression, entrained abnormal sleep rhythm, OSA chest tightness.    Epworth Sleepiness Score: 15/ 24 at first visit- now on CPAP  , Fatigue severity score 55/63 points.      Social History   Socioeconomic History  . Marital status: Married    Spouse name: Not on file  . Number of children: Not on file  . Years of education: Not on file  . Highest education level: Not on file  Occupational History  . Not on file  Social Needs  .  Financial resource strain: Not on file  . Food insecurity:    Worry: Not on file    Inability: Not on file  . Transportation needs:    Medical: Not on file    Non-medical: Not on file  Tobacco Use  . Smoking status: Never Smoker  . Smokeless tobacco: Never Used  Substance and Sexual Activity  . Alcohol use: Yes    Alcohol/week: 1.0 standard drinks    Types: 1 Cans of beer per week  . Drug use: No  . Sexual activity: Yes    Birth control/protection: Other-see comments    Comment: vasectomy  Lifestyle  . Physical activity:    Days per week: Not on file    Minutes per session: Not on file  . Stress: Not on file  Relationships  . Social connections:    Talks on phone: Not on file    Gets together: Not on file    Attends religious service: Not on file    Active member of club or organization: Not on file    Attends meetings of clubs or organizations: Not on file    Relationship status: Not on file  . Intimate partner violence:    Fear of current or ex partner: Not on file    Emotionally abused: Not on file    Physically abused: Not on file    Forced sexual activity: Not on file  Other Topics Concern  . Not on file  Social History Narrative   Single   Self-employed   1 daughter   Exercise: yes   Caffeine use: yes    Family History  Problem Relation Age of Onset  . Depression Mother   . Diabetes  Mother   . Asthma Mother   . Migraines Mother   . Hypertension Mother   . Diabetes Father   . Heart disease Father   . Hypertension Father   . Dementia Maternal Grandmother   . Breast cancer Maternal Grandmother   . Heart disease Maternal Grandfather   . Dementia Paternal Grandmother   . Stroke Paternal Grandmother   . Arthritis Paternal Grandfather   . Heart disease Paternal Grandfather     Past Medical History:  Diagnosis Date  . Allergy   . Asthma   . Bipolar disorder (HCC)    Type 2  . Depression   . Headache    Migraines  . IBS (irritable bowel syndrome)   . Insomnia   . PCOS (polycystic ovarian syndrome)   . Pneumonia    history of  . Vitamin D deficiency     Past Surgical History:  Procedure Laterality Date  . BLADDER SUSPENSION N/A 08/30/2013   Procedure: TRANSVAGINAL TAPE (TVT) PROCEDURE;  Surgeon: Lavina Hamman, MD;  Location: [facility];  Service: Gynecology;  Laterality: N/A;  . EYE SURGERY    . HYSTEROSCOPY WITH NOVASURE N/A 08/30/2013   Procedure: HYSTEROSCOPY WITH NOVASURE;  Surgeon: Lavina Hamman, MD;  Location: [facility];  Service: Gynecology;  Laterality: N/A;  1 1/2 hrs OR time  . LAPAROSCOPIC TUBAL LIGATION Bilateral 11/20/2015   Procedure: LAPAROSCOPIC TUBAL LIGATION;  Surgeon: Lavina Hamman, MD;  Location: [facility];  Service: Gynecology;  Laterality: Bilateral;  . SALPINGECTOMY    . wisdom tooth extraction      Current Outpatient Medications  Medication Sig Dispense Refill  . albuterol (PROVENTIL HFA;VENTOLIN HFA) 108 (90 Base) MCG/ACT inhaler Inhale 1-2 puffs into the lungs every 6 (six) hours as needed for wheezing or shortness of breath.    Marland Kitchen  albuterol (PROVENTIL) (2.5 MG/3ML) 0.083% nebulizer solution 1 vial in neb every 6 hours as needed  1  . azithromycin (ZITHROMAX) 250 MG tablet Zpack taper as directed (re: asthmatic bronchitis) 6 tablet 0  . benzonatate (TESSALON) 200 MG capsule Take 1 capsule (200 mg total) by mouth 3 (three) times daily as  needed for cough. 45 capsule 2  . budesonide-formoterol (SYMBICORT) 80-4.5 MCG/ACT inhaler .Take 2 puffs first thing in am and then another 2 puffs about 12 hours later. 1 Inhaler 12  . clonazePAM (KLONOPIN) 0.5 MG tablet TAKE 1 TABLET BY MOUTH 2 TIMES DAILY AS NEEDED FOR ANXIETY  0  . cyclobenzaprine (FLEXERIL) 10 MG tablet Take 10 mg by mouth 3 (three) times daily as needed for muscle spasms.    Marland Kitchen. guaiFENesin-codeine 100-10 MG/5ML syrup Take 5 mLs by mouth every 6 (six) hours as needed for cough. 180 mL 0  . lamoTRIgine (LAMICTAL) 150 MG tablet Take 150 mg by mouth daily.    Marland Kitchen. levocetirizine (XYZAL) 5 MG tablet Take 5 mg by mouth every evening.    . modafinil (PROVIGIL) 200 MG tablet Start out by taking 1/2 tablet upon awakening. If after 7 days you find you tolerate the medication and feel that you need additional help you can increase to full tablet. 30 tablet 5  . montelukast (SINGULAIR) 10 MG tablet Take 10 mg by mouth at bedtime.    . predniSONE (STERAPRED UNI-PAK 48 TAB) 10 MG (48) TBPK tablet TAKE BY MOUTH DAILY. 60MG  X 4DAYS 40MG  X 4 DAYS 20MG  X 4 DAYS    . traZODone (DESYREL) 100 MG tablet Take 100 mg by mouth at bedtime.    Marland Kitchen. UNABLE TO FIND Med Name: CPAP    . zaleplon (SONATA) 10 MG capsule Take 1 capsule (10 mg total) by mouth at bedtime as needed for sleep. 30 capsule 5   No current facility-administered medications for this visit.     Allergies as of 03/23/2019 - Review Complete 03/15/2019  Allergen Reaction Noted  . Shrimp [shellfish allergy] Anaphylaxis 11/28/2016  . Latex Itching 08/25/2013  . Levaquin [levofloxacin in d5w] Hives 12/30/2012  . Levofloxacin Hives 01/31/2019  . Sulfa antibiotics Rash 05/06/2016    Vitals: There were no vitals taken for this visit. Last Weight:  Wt Readings from Last 1 Encounters:  01/31/19 191 lb 9.6 oz (86.9 kg)   ZOX:WRUEABMI:There is no height or weight on file to calculate BMI.     Last Height:   Ht Readings from Last 1 Encounters:   01/31/19 5\' 3"  (1.6 m)       Observations/Objective:    General: The patient is awake, alert and appears not in acute distress. The patient is well groomed. Head: Normocephalic, atraumatic. Neck is supple. Mallampati 4, overbite -  neck circumference:14.5 ".  Nasal airflow patent  Retrognathia is seen.  Cardiovascular:  .Respiratory:  Skin:  Trunk: BMI is 32.27. The patient's posture is erect.  Neurologic exam : The patient is awake and alert, oriented to place and time.   Memory subjective described as intact.  Attention span & concentration ability appears normal.  Speech is fluent,  without dysarthria, dysphonia or aphasia.  Mood and affect are appropriate.  Cranial nerves: Loss of sense of smell !   Pupils are equal and  strabismus with enophthalmos and mild ptosis on the left- in vertical and horizontal planes intact and without nystagmus. Diplopia only when fatigued.   Facial motor strength is symmetric and tongue and uvula  move midline. Shoulder shrug was symmetrical.  Motor exam: Normal muscle bulk and symmetric strength in all extremities. /hands was normal without evidence of ataxia, dysmetria or tremor. Gait and station: Patient walks without assistive device .   Assessment and Plan;  Please use your CPAP daily, and as many hours as possible. Compliance needs to improve by hours , not days.  Use your inhaler. Use CPAP when you use codeine.    Diagnosed with OSA by HST, see below.   I discussed the assessment and treatment plan with the patient. The patient was provided an opportunity to ask questions and all were answered. The patient agreed with the plan and demonstrated an understanding of the instructions.   The patient was advised to call back or seek an in-person evaluation if the symptoms worsen or if the condition fails to improve as anticipated.  I provided 22 minutes of non-face-to-face time during this encounter.    PS :  NAME:  Kristi Davies                                                            DOB: 1976/04/03 MEDICAL RECORD No: [id number]                                      DOS: 11/01/2018  REFERRING PHYSICIAN: Zoe Lan, NP STUDY PERFORMED: Home Sleep Test on [facility] HISTORY: Kristi Davies is a 43 y.o. female patient seen here on 09-21-2018  in a referral from NP Jones for a sleep apnea evaluation , evaluation of excessive daytime sleepiness.  Mrs. Hemrick has suffered from excessive daytime sleepiness and a high degree of fatigue for several months, she was evaluated by pulmonology for a concern of chronic cough, she has asthma.   Epworth Sleepiness Score: 15/ 24 points, Fatigue severity score 55/63 points, BMI 32 kg/m2, STUDY RESULTS:  Total Recording Time: 8 hours 45 minutes, Valid test time 7 h 38 min, Total Apnea/Hypopnea Index (AHI):  19.5 /h; RDI: 21.3 /h Average Oxygen Saturation:   93 %; Lowest Oxygen Desaturation: 82 %.  Total Time Oxygen Saturation below 89%: 1.4 minutes.  Average Heart Rate:  74 bpm (between 55 and 106 bpm). IMPRESSION: Moderate Sleep apnea, Obstructive type. Moderately loud snoring.  No oxygen desaturation of clinical significance.  Strong REM sleep accentuation (estimated) AHI in REM 46.9/h.   RECOMMENDATION: The patient has REM accentuated apnea which is not responsive to a dental device, and should be treated with CPAP. I will order auto CPAP 5-15 cm water, 3 cm EPR , mask of comfort and heated humidity.   I certify that I have reviewed the raw data recording prior to the issuance of this report in accordance with the standards of the American Academy of Sleep Medicine (AASM). Melvyn Novas, M.D.  11-09-2018     Medical Director of [facility], accredited by the AASM. Diplomat of the ABPN and ABSM.   The patient was advised of the nature of the diagnosed disorder , the treatment options and the  risks for general health and wellness arising from not treating the condition.   I  spent more than 60 minutes of face to face time with the patient.  Greater than 50% of time was spent in counseling and coordination of care. We have discussed the diagnosis and differential and I answered the patient's questions.    Plan:  Treatment plan and additional workup :   Dear Zoe Lan, NP :  Circadian rhythm disorders are followed by tertiary care center psychiatry/ psychology.  If sleep resetting, Sonata and behavior modification do not work, I would defer to your colleagues at [facility], MD   03/23/2019, 2:53 PM  Certified in Neurology by ABPN Certified in Sleep Medicine by [facility] [address], [address] [place], [place] [address]

## 2019-03-23 NOTE — Patient Instructions (Signed)
This information is directly available on the CDC website: https://www.cdc.gov/coronavirus/2019-ncov/if-you-are-sick/steps-when-sick.html    Source:CDC Reference to specific commercial products, manufacturers, companies, or trademarks does not constitute its endorsement or recommendation by the U.S. Government, Department of Health and Human Services, or Centers for Disease Control and Prevention.  

## 2019-03-27 ENCOUNTER — Telehealth: Payer: Self-pay | Admitting: Neurology

## 2019-03-27 NOTE — Telephone Encounter (Signed)
LVM to schedule 6 month follow-up with NP per Dr. Dohmeier. °

## 2019-04-07 ENCOUNTER — Other Ambulatory Visit: Payer: Self-pay | Admitting: Internal Medicine

## 2019-04-10 ENCOUNTER — Telehealth: Payer: Self-pay | Admitting: Primary Care

## 2019-04-10 NOTE — Telephone Encounter (Signed)
Since her husband is emt and she has had flu like illness prefer she come in for eval first but needs to wear mask as this could have been mild covid infection as some pts are completely asymptomatic and not good to go back to work/school if still coughing

## 2019-04-10 NOTE — Telephone Encounter (Signed)
Make sure no flu like symptoms x > 2 weeks then ok to return to work but must wear face mask and wash hands regularly.

## 2019-04-10 NOTE — Telephone Encounter (Signed)
Spoke with the pt  She had televisit with [facility] 03/15/19 for flu like symptoms  She completed pred and feels much improved  She still has minimal cough- non prod "asthma cough"- tessalon helps  She is requesting note to return to work and she can print this from [employer] She denies any SOB, fever, chills, muscle aches, HA, sore throat, nasal congestion, wheezing, chest tightness  Please advise thanks!

## 2019-04-11 ENCOUNTER — Ambulatory Visit: Payer: 59 | Admitting: Adult Health

## 2019-04-11 NOTE — Telephone Encounter (Signed)
LMTCB  Per MW pt needs ov to be cleared to return to work.

## 2019-04-12 NOTE — Telephone Encounter (Signed)
Per patient's chart, she has been scheduled to see MW on 04/17/19 to address her note.   Will close this encounter.

## 2019-04-17 ENCOUNTER — Ambulatory Visit: Payer: 59 | Admitting: Internal Medicine

## 2019-04-17 ENCOUNTER — Other Ambulatory Visit: Payer: Self-pay

## 2019-04-17 ENCOUNTER — Encounter: Payer: Self-pay | Admitting: *Deleted

## 2019-04-17 ENCOUNTER — Encounter: Payer: Self-pay | Admitting: Internal Medicine

## 2019-04-17 VITALS — BP 104/70 | HR 80 | Temp 98.2°F | Ht 63.0 in | Wt 190.0 lb

## 2019-04-17 DIAGNOSIS — R6889 Other general symptoms and signs: Secondary | ICD-10-CM | POA: Diagnosis not present

## 2019-04-17 DIAGNOSIS — J45991 Cough variant asthma: Secondary | ICD-10-CM

## 2019-04-17 NOTE — Patient Instructions (Addendum)
Resume flonase to see if tickle gets better    Plan A = Automatic = Symbicort 160 Take 2 puffs first thing in am and then another 2 puffs about 12 hours later.   Plan B = Backup Only use your albuterol inhaler as a rescue medication to be used if you can't catch your breath by resting or doing a relaxed purse lip breathing pattern.  - The less you use it, the better it will work when you need it. - Ok to use the inhaler up to 2 puffs  every 4 hours if you must but call for appointment if use goes up over your usual need - Don't leave home without it !!  (think of it like the spare tire for your car)   Plan C = Crisis - only use your albuterol nebulizer if you first try Plan B and it fails to help > ok to use the nebulizer up to every 4 hours but if start needing it regularly call for immediate appointment   Please remember to go to the lab department   for your tests - we will call you with the results when they are available.

## 2019-04-17 NOTE — Progress Notes (Signed)
Subjective:     Patient ID: Kristi Davies, female   DOB: [birth year],   MRN: [id number]    Brief patient profile:  43 yowf never smoker and @ around age 42-13 noted poor ex tol ? EIA worse with extremes of temp and never dx or rx and post term pregancy check up ? 6 month p partum at age 43 wheezy per ob > rx flovent seemed to help then around 2014 worse on flovent and started needing regular albuterol then severe cold spring 2018 and persistent cough since > tried on on BREO made it worse, advair no better but symbiocort ? 160 strength some better but cough still triggered by smells/ perfume and needing neb up to twice a week so referred to pulmonary clinic 03/29/2018 by Dr   Zoe Lan     History of Present Illness  03/29/2018 1st [facility] office visit/ Britta Louth   Chief Complaint  Patient presents with  . Pulmonary Consult    Referred by Zoe Lan, PA. She was dxed with Asthma approx 20 yrs ago. She has had increased cough over the past year- non prod.  She also c/o SOB that comes and goes- worse with exercise and cleaning. She is using her albuterol inhaler 1 x per wk and neb with albuterol 2 x per wk on average.   still doe x nature hike up hills /eliptical x 15-20 min slow moderate but doet not provoke the cough  Also dx of sinusitis in past but no ent eval or sinus CT  Allergy shots x 6 months at [facility] never able to get above lowest strength>stopped around 2014  "peak flows are always nl Cough is dry daytime > noct  "having to use cough drops all the time". rec For cough  > tessalon 200 mg every 8 hours as needed Try symbicort 80 Take 2 puffs first thing in am and then another 2 puffs about 12 hours later.  Only use your albuterol as a rescue medication  Pantoprazole (protonix) 40 mg   Take  30-60 min before first meal of the day and Pepcid (famotidine)  20 mg one hour before   bedtime until return to office - this is the best way to tell whether stomach acid is contributing to  your problem.   GERD diet. Please see patient coordinator before you leave today  to schedule sinus CT      04/27/2018  f/u ov/Cai Flott re: cough on macrodantn  Chief Complaint  Patient presents with  . Follow-up    Cough and SOB have been worse since the last visit. She has had a sore throat for the past wk. Cough has been non prod. She has been using her albuterol inhaler at least 2 x daily and neb with albuterol once per wk on average.   Dyspnea:  Can't get a full breath in Cough: worse with ex Sleep: ok  SABA use:  As above, just with activity   Tessalon seems to help when takes it but presently using no more than once a day rec Avoid macrodantin/ no  Change in rx  06/10/2018  f/u ov/Anjali Manzella re: cough variant asthma on sym 80/singulair maint  Chief Complaint  Patient presents with  . Follow-up    Breathing is overall doing well. She only uses her albuterol inhaler before exercise.  Dyspnea:  Uses saba before late afternoon [employer]/ eliptical otherwise has doe Cough: none SABA use: just with ex as above  rec Work on inhaler technique  12/12/2018  f/u ov/Crissy Mccreadie re: cough variant asthma/ symb 80 2bid/ singualir rx  Chief Complaint  Patient presents with  . Follow-up    Breathing is doing well today. She was dxed with OSA and started on CPAP per Dr. Golden Hurter.  She is NOT using her albuterol inhaler and neb recently.   Dyspnea:  On feet all day / no trouble with doe  Cough: none other than occ pnds Sleeping: on cpap on bed/ bed is flat SABA use: not needing  02: none rec rx nasal steroids (flonase)     04/17/2019  f/u ov/Mckinsley Koelzer re: cough variant asthma vs uace Chief Complaint  Patient presents with  . Follow-up    Still has occ cough with exertion such as taking a brisk walk. Cough is non prod. She has been using her neb daily but not her albuterol inhaler.   better on flonase but stopped it now worse  Sick since late march 2020/ husband is paramedic similar illness   Fever on off to 102 x sev weeks then completely gone x 2 weeks  Dyspnea:  Doing eliptical, only issue cough during and after ex  Cough: no cough at hs/ no longer needing codeine / still has throat tickle daytime Sleeping: on side / bed is flat one pill- cough / breathing not waking her. SABA use: starting late march 2020 using  neb  But now only qod, skipped saba hfa as plan B 02: none     No obvious day to day or daytime variability or assoc excess/ purulent sputum or mucus plugs or hemoptysis or cp or chest tightness, subjective wheeze or overt sinus or hb symptoms.   Sleeping as above  without nocturnal  or early am exacerbation  of respiratory  c/o's or need for noct saba. Also denies any obvious fluctuation of symptoms with weather or environmental changes or other aggravating or alleviating factors except as outlined above   No unusual exposure hx or h/o childhood pna/ asthma or knowledge of premature birth.  Current Allergies, Complete Past Medical History, Past Surgical History, Family History, and Social History were reviewed in [employer] record.  ROS  The following are not active complaints unless bolded Hoarseness, sore throat, dysphagia, dental problems, itching, sneezing,  nasal congestion or discharge of excess mucus or purulent secretions, ear ache,   fever, chills, sweats, unintended wt loss or wt gain, classically pleuritic or exertional cp,  orthopnea pnd or arm/hand swelling  or leg swelling, presyncope, palpitations, abdominal pain, anorexia, nausea, vomiting, diarrhea  or change in bowel habits or change in bladder habits, change in stools or change in urine, dysuria, hematuria,  rash, arthralgias, visual complaints, headache, numbness, weakness or ataxia or problems with walking or coordination,  change in mood or  memory.        Current Meds  Medication Sig  . albuterol (PROVENTIL HFA;VENTOLIN HFA) 108 (90 Base) MCG/ACT inhaler Inhale 1-2 puffs  into the lungs every 6 (six) hours as needed for wheezing or shortness of breath.  Marland Kitchen albuterol (PROVENTIL) (2.5 MG/3ML) 0.083% nebulizer solution 1 vial in neb every 6 hours as needed  . benzonatate (TESSALON) 200 MG capsule TAKE 1 CAPSULE BY MOUTH EVERY DAY 3 TIMES A DAY AS NEEDED FOR COUGH  . budesonide-formoterol (SYMBICORT) 80-4.5 MCG/ACT inhaler .Take 2 puffs first thing in am and then another 2 puffs about 12 hours later.  . clonazePAM (KLONOPIN) 0.5 MG tablet TAKE 1 TABLET BY MOUTH 2 TIMES DAILY AS NEEDED  FOR ANXIETY  . cyclobenzaprine (FLEXERIL) 10 MG tablet Take 10 mg by mouth 3 (three) times daily as needed for muscle spasms.  Marland Kitchen. guaiFENesin-codeine 100-10 MG/5ML syrup Take 5 mLs by mouth every 6 (six) hours as needed for cough.  . lamoTRIgine (LAMICTAL) 150 MG tablet Take 150 mg by mouth daily.  Marland Kitchen. levocetirizine (XYZAL) 5 MG tablet Take 5 mg by mouth every evening.  . modafinil (PROVIGIL) 200 MG tablet Start out by taking 1/2 tablet upon awakening. If after 7 days you find you tolerate the medication and feel that you need additional help you can increase to full tablet.  . montelukast (SINGULAIR) 10 MG tablet Take 10 mg by mouth at bedtime.  . traZODone (DESYREL) 100 MG tablet Take 100 mg by mouth at bedtime.  Marland Kitchen. UNABLE TO FIND Med Name: CPAP  . zaleplon (SONATA) 10 MG capsule Take 1 capsule (10 mg total) by mouth at bedtime as needed for sleep.              Objective:   Physical Exam    amb obese wf nad/ no spont cough but urge to cough at end exp on fvc   04/17/2019        190  12/12/2018          182  06/10/2018          187   04/27/2018       187   03/29/18 187 lb 3.2 oz (84.9 kg)  11/12/17 193 lb 12.8 oz (87.9 kg)    Vital signs reviewed - Note on arrival 02 sats  99% on RA    HEENT: nl dentition, turbinates bilaterally, and oropharynx. Nl external ear canals without cough reflex   NECK :  without JVD/Nodes/TM/ nl carotid upstrokes bilaterally   LUNGS: no acc muscle  use,  Nl contour chest which is clear to A and P bilaterally    CV:  RRR  no s3 or murmur or increase in P2, and no edema   ABD:  soft and nontender with nl inspiratory excursion in the supine position. No bruits or organomegaly appreciated, bowel sounds nl  MS:  Nl gait/ ext warm without deformities, calf tenderness, cyanosis or clubbing No obvious joint restrictions   SKIN: warm and dry without lesions    NEURO:  alert, approp, nl sensorium with  no motor or cerebellar deficits apparent.           Assessment:

## 2019-04-18 ENCOUNTER — Telehealth: Payer: Self-pay | Admitting: Internal Medicine

## 2019-04-18 ENCOUNTER — Encounter: Payer: Self-pay | Admitting: Internal Medicine

## 2019-04-18 LAB — SAR COV2 SEROLOGY (COVID19)AB(IGG),IA: SARS CoV2 AB IGG: NEGATIVE

## 2019-04-18 NOTE — Assessment & Plan Note (Signed)
Onset ? As teenager - [name] 03/29/2018  =   11 on symb 160  - 03/29/2018  After extensive coaching inhaler device  effectiveness =    90% with spacer > try reduce symbicort to 80 2bid - Spirometry 03/29/2018  Nl with no curvature on symb 160  Sinus CT 04/04/18 > Well-aerated paranasal sinuses  Allergy profile 04/27/2018 >  Eos 0.1 /  IgE  29 RAST only pos dust/ roaches - Stop macrodantin 04/27/2018 with esr 20  rx   pred x 12 days > complete resolution of all symptoms    - 04/17/2019  After extensive coaching inhaler device,  effectiveness =    90%  > rec try symb 160 2bid and  restart flonase  Reviewed approp use of saba with her again.   If your breathing worsens or you need to use your rescue inhaler more than twice weekly or wake up more than twice a month with any respiratory symptoms or require more than two rescue inhalers per year, we need to see you right away because this means we're not controlling the underlying problem (inflammation) adequately.  Rescue inhalers (albuterol) do not control inflammation and overuse can lead to unnecessary and costly consequences.  They can make you feel better temporarily but eventually they will quit working effectively much as sleep aids lead to more insomnia if used regularly.

## 2019-04-18 NOTE — Telephone Encounter (Signed)
  Advised pt of results. Pt understood and nothing further is needed.   Letter written to return to work and sent through [employer].  Notes recorded by Nyoka Cowden, MD on 04/18/2019 at 5:10 AM EDT Call patient : Study is unremarkable, this is suggestive but not definitive, that she did not have covid so ok to return to work/ write letter resume effective today s restrictions

## 2019-04-18 NOTE — Progress Notes (Signed)
lmtcb

## 2019-04-18 NOTE — Telephone Encounter (Signed)
ATC pt, no answer. Left message for pt to call back.  

## 2019-04-18 NOTE — Assessment & Plan Note (Signed)
Onset late march 2020 and complete resolution by 3rd week in Feb 2020 - Covid 19 serology 04/17/2019 = neg   Even if she had covid it's been over 2 weeks since any symptoms and we are not presently doing covid pcr testing on asymptomatic individuals so ok to to resume work with mask and distancing enforced.    I had an extended discussion with the patient reviewing all relevant studies completed to date and  lasting 15 to 20 minutes of a 25 minute visit    See device teaching which extended face to face time for this visit.  Each maintenance medication was reviewed in detail including emphasizing most importantly the difference between maintenance and prns and under what circumstances the prns are to be triggered using an action plan format that is not reflected in the computer generated alphabetically organized AVS which I have not found useful in most complex patients, especially with respiratory illnesses  Please see AVS for specific instructions unique to this visit that I personally wrote and verbalized to the the pt in detail and then reviewed with pt  by my nurse highlighting any  changes in therapy recommended at today's visit to their plan of care.

## 2019-04-21 IMAGING — CT CT PARANASAL SINUSES LIMITED
1 of 2 series · 7 of 10 positions shown, 9 images · non-contrast
Comparison: None.

CLINICAL DATA: Persistent cough for a year.

EXAM:
CT PARANASAL SINUS LIMITED WITHOUT CONTRAST
TECHNIQUE: Non-contiguous multidetector CT images of the paranasal sinuses were
obtained in a single plane without contrast.

[Series 4: limited sinus st · axial · 0.24mm/px · z∈[+121,+181]mm · 7 of 9 slices shown, 9 images]
[im 2/9  brain]
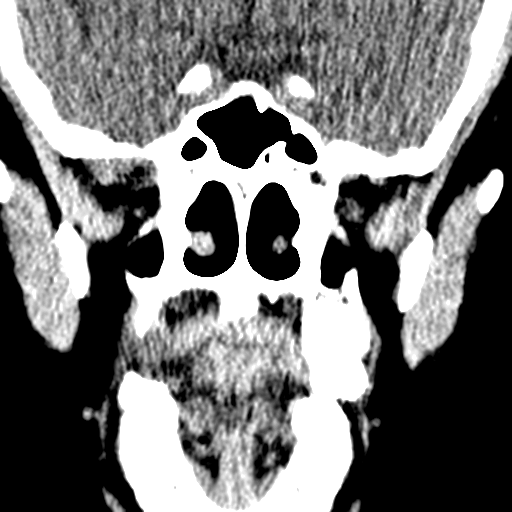
[im 2/9  bone]
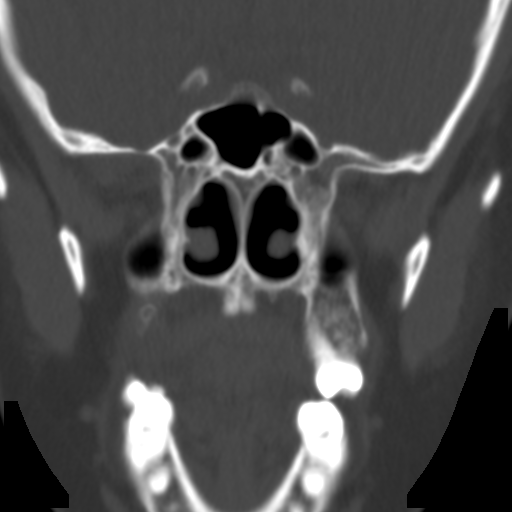
[im 3/9  bone]
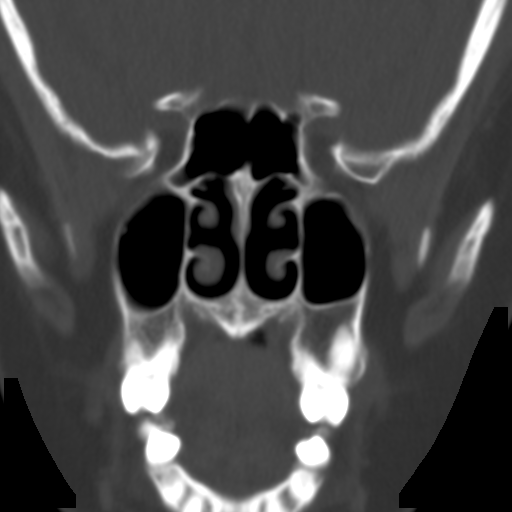
[im 4/9  bone]
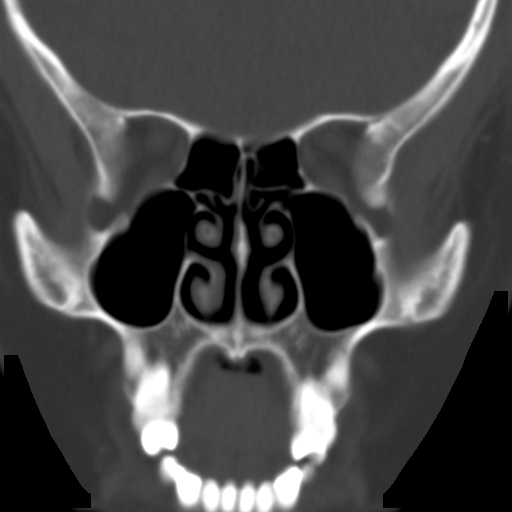
[im 5/9  bone]
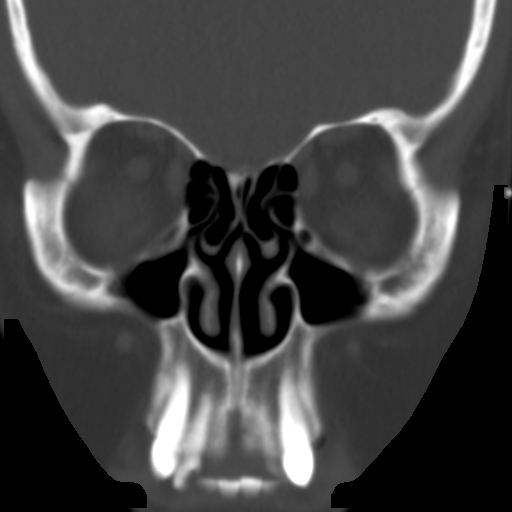
[im 6/9  brain]
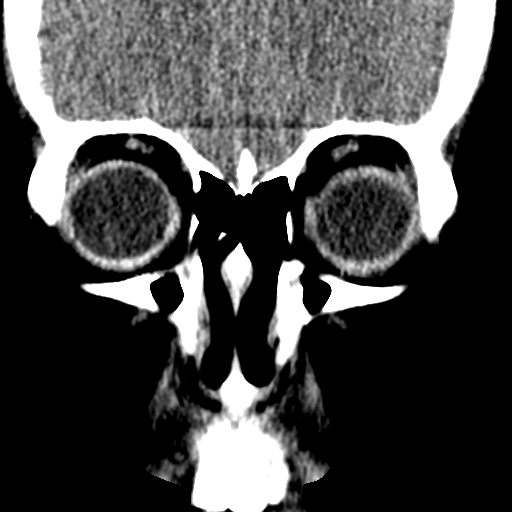
[im 6/9  bone]
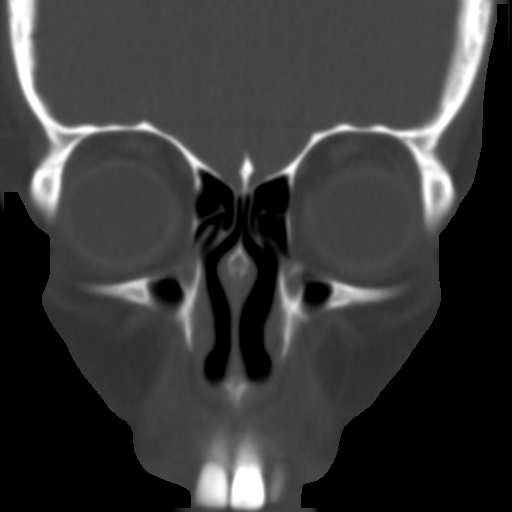
[im 7/9  bone]
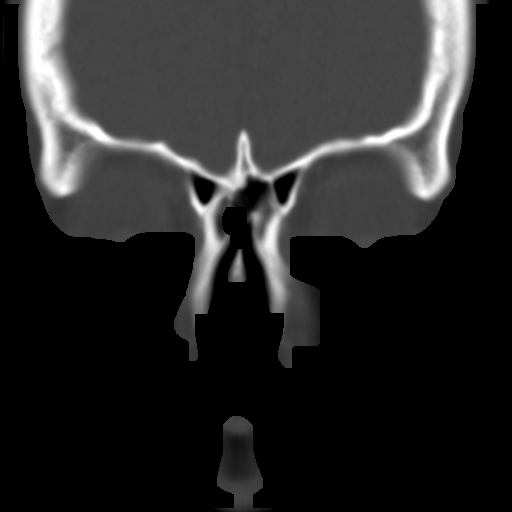
[im 8/9  bone]
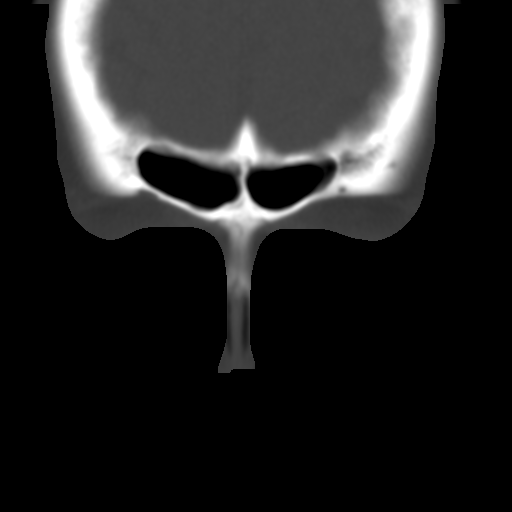

[7 of 10 positions shown; findings below may reference images not displayed]

FINDINGS: SINUSES: Maxillary, ethmoid, sphenoid and frontal sinuses are well
aerated. No abnormal antral expansion or atresia, bony wall
thickening/mucoperiosteal reaction. Nasal septum is midline.
Bilateral concha bullosa.

OSTIA: Bilateral ostiomeatal units are patent.

FACIAL BONES: No destructive bony lesions.

ORBITS: Ocular globes and orbital contents are non-suspicious by
low-dose protocol.
IMPRESSION: Well-aerated paranasal sinuses.

## 2019-05-20 ENCOUNTER — Other Ambulatory Visit: Payer: Self-pay | Admitting: Neurology

## 2019-06-20 ENCOUNTER — Telehealth: Payer: Self-pay | Admitting: Internal Medicine

## 2019-06-20 NOTE — Telephone Encounter (Signed)
Called pt but unable to reach. Left message for pt to return call. 

## 2019-06-21 ENCOUNTER — Telehealth (INDEPENDENT_AMBULATORY_CARE_PROVIDER_SITE_OTHER): Payer: 59 | Admitting: Primary Care

## 2019-06-21 ENCOUNTER — Encounter: Payer: Self-pay | Admitting: Primary Care

## 2019-06-21 DIAGNOSIS — U071 COVID-19: Secondary | ICD-10-CM | POA: Diagnosis not present

## 2019-06-21 DIAGNOSIS — J4521 Mild intermittent asthma with (acute) exacerbation: Secondary | ICD-10-CM

## 2019-06-21 MED ORDER — PREDNISONE 10 MG PO TABS: ORAL_TABLET | ORAL | 0 refills | Status: DC

## 2019-06-21 MED ORDER — GUAIFENESIN-CODEINE 100-10 MG/5ML PO SOLN: 5.0000 mL | Freq: Four times a day (QID) | ORAL | 0 refills | Status: DC | PRN

## 2019-06-21 NOTE — Telephone Encounter (Signed)
Primary Pulmonologist: Wert Last office visit and with whom: 04/17/2019 w/ Melvyn Novas What do we see them for (pulmonary problems): Cough variant asthma  Reason for call: called spoke with patient who reported that she tested positive for COVID through her PCP w/ [facility] on 7/1.  Patient's symptoms were nearly resolved until about 3 days ago when her cough returned, and is worse.  Patient has also completely lost her sense of smell.  Cough is described as mainly dry hacky, but does occasionally produce clear mucus.  She has "a little bit of wheezing and chest tightness"; her rescue inhaler helps with this but "for only an hour or so."  Patient has been taking Delsym, Tessalon with no relief and some Codeine cough syrup that was left over from April 2020 visit with [facility] NP and this did help a little with sleeping.  Patient is concerned with how violent her coughing is and is requesting a visit - MyChart video visit scheduled with Beth NP for this afternoon at 4:15pm (pt requested latest appt possible).    Nothing further needed at this time; will sign off.

## 2019-06-21 NOTE — Patient Instructions (Addendum)
RX:  Prednisone 20mg  x 5 days  Refill codeine cough syrup - use as needed with safe precautions   Recommendations: Resume flonase 1 spray per nostril daily for 2 weeks Stay well hydrated Monitor temperature and O2 daily   Follow-up: Recommend repeat COVID testing with PCP office (when free from symptoms or in 2 weeks) Call if cough not better in 7-10 days  ED if breathing acutely worsens or develops fever >101       Person Under Monitoring Name: Kristi Davies  Location: [place] [address]   Infection Prevention Recommendations for Individuals Confirmed to have, or Being Evaluated for, 2019 Novel Coronavirus (COVID-19) Infection Who Receive Care at Home  Individuals who are confirmed to have, or are being evaluated for, COVID-19 should follow the prevention steps below until a healthcare provider or local or state health department says they can return to normal activities.  Stay home except to get medical care You should restrict activities outside your home, except for getting medical care. Do not go to work, school, or public areas, and do not use public transportation or taxis.  Call ahead before visiting your doctor Before your medical appointment, call the healthcare provider and tell them that you have, or are being evaluated for, COVID-19 infection. This will help the healthcare provider's office take steps to keep other people from getting infected. Ask your healthcare provider to call the local or state health department.  Monitor your symptoms Seek prompt medical attention if your illness is worsening (e.g., difficulty breathing). Before going to your medical appointment, call the healthcare provider and tell them that you have, or are being evaluated for, COVID-19 infection. Ask your healthcare provider to call the local or state health department.  Wear a facemask You should wear a facemask that covers your nose and mouth when you are  in the same room with other people and when you visit a healthcare provider. People who live with or visit you should also wear a facemask while they are in the same room with you.  Separate yourself from other people in your home As much as possible, you should stay in a different room from other people in your home. Also, you should use a separate bathroom, if available.  Avoid sharing household items You should not share dishes, drinking glasses, cups, eating utensils, towels, bedding, or other items with other people in your home. After using these items, you should wash them thoroughly with soap and water.  Cover your coughs and sneezes Cover your mouth and nose with a tissue when you cough or sneeze, or you can cough or sneeze into your sleeve. Throw used tissues in a lined trash can, and immediately wash your hands with soap and water for at least 20 seconds or use an alcohol-based hand rub.  Wash your [facility] your hands often and thoroughly with soap and water for at least 20 seconds. You can use an alcohol-based hand sanitizer if soap and water are not available and if your hands are not visibly dirty. Avoid touching your eyes, nose, and mouth with unwashed hands.   Prevention Steps for Caregivers and Household Members of Individuals Confirmed to have, or Being Evaluated for, COVID-19 Infection Being Cared for in the Home  If you live with, or provide care at home for, a person confirmed to have, or being evaluated for, COVID-19 infection please follow these guidelines to prevent infection:  Follow healthcare provider's instructions Make sure that you  understand and can help the patient follow any healthcare provider instructions for all care.  Provide for the patient's basic needs You should help the patient with basic needs in the home and provide support for getting groceries, prescriptions, and other personal needs.  Monitor the patient's symptoms If they are  getting sicker, call his or her medical provider and tell them that the patient has, or is being evaluated for, COVID-19 infection. This will help the healthcare provider's office take steps to keep other people from getting infected. Ask the healthcare provider to call the local or state health department.  Limit the number of people who have contact with the patient  If possible, have only one caregiver for the patient.  Other household members should stay in another home or place of residence. If this is not possible, they should stay  in another room, or be separated from the patient as much as possible. Use a separate bathroom, if available.  Restrict visitors who do not have an essential need to be in the home.  Keep older adults, very young children, and other sick people away from the patient Keep older adults, very young children, and those who have compromised immune systems or chronic health conditions away from the patient. This includes people with chronic heart, lung, or kidney conditions, diabetes, and cancer.  Ensure good ventilation Make sure that shared spaces in the home have good air flow, such as from an air conditioner or an opened window, weather permitting.  Wash your hands often  Wash your hands often and thoroughly with soap and water for at least 20 seconds. You can use an alcohol based hand sanitizer if soap and water are not available and if your hands are not visibly dirty.  Avoid touching your eyes, nose, and mouth with unwashed hands.  Use disposable paper towels to dry your hands. If not available, use dedicated cloth towels and replace them when they become wet.  Wear a facemask and gloves  Wear a disposable facemask at all times in the room and gloves when you touch or have contact with the patient's blood, body fluids, and/or secretions or excretions, such as sweat, saliva, sputum, nasal mucus, vomit, urine, or feces.  Ensure the mask fits over your  nose and mouth tightly, and do not touch it during use.  Throw out disposable facemasks and gloves after using them. Do not reuse.  Wash your hands immediately after removing your facemask and gloves.  If your personal clothing becomes contaminated, carefully remove clothing and launder. Wash your hands after handling contaminated clothing.  Place all used disposable facemasks, gloves, and other waste in a lined container before disposing them with other household waste.  Remove gloves and wash your hands immediately after handling these items.  Do not share dishes, glasses, or other household items with the patient  Avoid sharing household items. You should not share dishes, drinking glasses, cups, eating utensils, towels, bedding, or other items with a patient who is confirmed to have, or being evaluated for, COVID-19 infection.  After the person uses these items, you should wash them thoroughly with soap and water.  Wash laundry thoroughly  Immediately remove and wash clothes or bedding that have blood, body fluids, and/or secretions or excretions, such as sweat, saliva, sputum, nasal mucus, vomit, urine, or feces, on them.  Wear gloves when handling laundry from the patient.  Read and follow directions on labels of laundry or clothing items and detergent. In general, wash and  dry with the warmest temperatures recommended on the label.  Clean all areas the individual has used often  Clean all touchable surfaces, such as counters, tabletops, doorknobs, bathroom fixtures, toilets, phones, keyboards, tablets, and bedside tables, every day. Also, clean any surfaces that may have blood, body fluids, and/or secretions or excretions on them.  Wear gloves when cleaning surfaces the patient has come in contact with.  Use a diluted bleach solution (e.g., dilute bleach with 1 part bleach and 10 parts water) or a household disinfectant with a label that says EPA-registered for coronaviruses.  To make a bleach solution at home, add 1 tablespoon of bleach to 1 quart (4 cups) of water. For a larger supply, add  cup of bleach to 1 gallon (16 cups) of water.  Read labels of cleaning products and follow recommendations provided on product labels. Labels contain instructions for safe and effective use of the cleaning product including precautions you should take when applying the product, such as wearing gloves or eye protection and making sure you have good ventilation during use of the product.  Remove gloves and wash hands immediately after cleaning.  Monitor yourself for signs and symptoms of illness Caregivers and household members are considered close contacts, should monitor their health, and will be asked to limit movement outside of the home to the extent possible. Follow the monitoring steps for close contacts listed on the symptom monitoring form.   ? If you have additional questions, contact your local health department or call the epidemiologist on call at [phone] (available 24/7). ? This guidance is subject to change. For the most up-to-date guidance from [facility], please refer to their website: TripMetro.huhttps://www.cdc.gov/coronavirus/2019-ncov/hcp/guidance-prevent-spread.html

## 2019-06-21 NOTE — Telephone Encounter (Signed)
Pt returned call to speak with a nurse so she can make an appt. If someone could give her a call back

## 2019-06-21 NOTE — Progress Notes (Signed)
Virtual Visit via Video Note  I connected with Kristi Davies on 06/21/19 at  4:15 PM EDT by a video enabled telemedicine application and verified that I am speaking with the correct person using two identifiers.  Location: Patient: Home Provider: Office   I discussed the limitations of evaluation and management by telemedicine and the availability of in person appointments. The patient expressed understanding and agreed to proceed.  History of Present Illness: 43 year old female, never smoked. PMH significant for cough variant asthma. Patient of Dr. Melvyn Novas, last seen on 04/17/19. She had flu like illness in March, SARS COV2 AB negative. Tested positive for COVID-19 on 06/07/19 with PCP. Husband, father-in-law and daughter also tested positive for covid.  06/21/2019 Patient contacted today for video visit with complaints of increased dry cough over the last 3-4 days. She is feeling relatively well considering recent covid diagnosis. Her cough was better but started again a few days ago. Lost sense of smell at the same time as her cough represented. She has mild shortness of breath at rest and wheezing/chest tightness. Continues using Symbicort 160 twice daily as directed. Typically uses albuterol rescue inhaler once a week after exercising. Taking Singulair, not currently using flonase. Used codeine the last couple of nights to help suppress cough so she could sleep. She has been afebrile since 7/10.   Observations/Objective: Afebrile for 5-7 days  O2 97-98% room air  Appears well, no observed respiratory distress  Assessment and Plan:  Asthmatic bronchitis d/t covid-19  - Prednisone 20mg  x 5 days  - Guaifenesin with codeine q6 hours prn cough  - discussed safe precautions  - Continue Symbicort 160 2 puffs BID - Resume flonase 1 spray per nostril daily for 2 weeks - Encourage oral hydration and patient to monitor daily temperatures and O2  - Recommend repeat COVID testing with PCP office (when  free from symptoms or in 2 weeks) - Ok to provided work note if needed   Follow Up Instructions: - Call if cough not better in 7-10 days  - ED if breathing acutely worsens or develops fever >101  I discussed the assessment and treatment plan with the patient. The patient was provided an opportunity to ask questions and all were answered. The patient agreed with the plan and demonstrated an understanding of the instructions.   The patient was advised to call back or seek an in-person evaluation if the symptoms worsen or if the condition fails to improve as anticipated.  I provided 22 minutes of non-face-to-face time during this encounter.   Martyn Ehrich, NP

## 2019-07-06 MED ORDER — ALBUTEROL SULFATE HFA 108 (90 BASE) MCG/ACT IN AERS: 1.0000 | INHALATION_SPRAY | Freq: Four times a day (QID) | RESPIRATORY_TRACT | 2 refills | Status: AC | PRN

## 2019-07-10 NOTE — Progress Notes (Signed)
Chart and office note reviewed in detail  > agree with a/p as outlined  Except by cdc guidelines does not need repeat covid testing as she is not immunocompromised but does need to continue all the standard restrictions = distance, masking , hand washing until change in recs by [facility]

## 2019-07-19 ENCOUNTER — Telehealth: Payer: Self-pay | Admitting: Internal Medicine

## 2019-07-19 NOTE — Telephone Encounter (Signed)
ATC pt, line went to voicemail. LMTCB X1.  

## 2019-07-20 NOTE — Telephone Encounter (Signed)
LVM for the patient to advise of Dr. Gustavus Bryant response and to call the office to confirm the message has been received. Also requested the patient call back to set up appointment if she wants to discuss the issue in more detail with Dr. Melvyn Novas.  Message left in triage in case patient returns call today.

## 2019-07-20 NOTE — Telephone Encounter (Signed)
Spoke with pt. States that she is still having issues with coughing. Cough is non productive at this time. Denies chest tightness, wheezing, shortness of breath or fever. Pt states that she cough so hard at times that she almost vomits. She has been taken Tessalon, Delysm, Albuterol HFA and occasional Guaifenesin-Codeine cough syrup at night. Pt would like MW's recommendations.  MW - please advise. Thanks!

## 2019-07-20 NOTE — Telephone Encounter (Signed)
She can try prilosec otc 20 mg bid ac (if she's vomiiting form coughing she's clearly refluxing)  but really prefer she come in for ov with all meds in hand to regroup

## 2019-07-21 NOTE — Telephone Encounter (Signed)
Called pt and advised message from the provider. Pt understood and verbalized understanding. Nothing further is needed.   Made an appt for 07/26/2019 at 915 am

## 2019-07-26 ENCOUNTER — Encounter: Payer: Self-pay | Admitting: Internal Medicine

## 2019-07-26 ENCOUNTER — Ambulatory Visit: Payer: 59 | Admitting: Internal Medicine

## 2019-07-26 ENCOUNTER — Ambulatory Visit (INDEPENDENT_AMBULATORY_CARE_PROVIDER_SITE_OTHER): Payer: 59

## 2019-07-26 ENCOUNTER — Other Ambulatory Visit: Payer: Self-pay

## 2019-07-26 DIAGNOSIS — J45991 Cough variant asthma: Secondary | ICD-10-CM

## 2019-07-26 DIAGNOSIS — R6889 Other general symptoms and signs: Secondary | ICD-10-CM | POA: Diagnosis not present

## 2019-07-26 MED ORDER — PREDNISONE 10 MG PO TABS: ORAL_TABLET | ORAL | 0 refills | Status: DC

## 2019-07-26 MED ORDER — PANTOPRAZOLE SODIUM 40 MG PO TBEC: DELAYED_RELEASE_TABLET | ORAL | 2 refills | Status: DC

## 2019-07-26 MED ORDER — GUAIFENESIN-CODEINE 100-10 MG/5ML PO SOLN: 5.0000 mL | Freq: Four times a day (QID) | ORAL | 0 refills | Status: DC | PRN

## 2019-07-26 NOTE — Patient Instructions (Addendum)
Prednisone 10 mg Take 4 for three days 3 for three days 2 for three days 1 for three days and stop   If can't stop cough>  Take the codeine every 4 hours and then taper over 3 days    Anytime your start coughing for a any reason >  protonix 40 mg Take 30- 60 min before your first and last meals of the day until no cough at all off all cough meds x 1 week  Keep previous appt, call sooner if needed

## 2019-07-26 NOTE — Assessment & Plan Note (Signed)
Onset ? As teenager - [name] 03/29/2018  =   11 on symb 160  - 03/29/2018  After extensive coaching inhaler device  effectiveness =    90% with spacer > try reduce symbicort to 80 2bid - Spirometry 03/29/2018  Nl with no curvature on symb 160  Sinus CT 04/04/18 > Well-aerated paranasal sinuses  Allergy profile 04/27/2018 >  Eos 0.1 /  IgE  29 RAST only pos dust/ roaches - Stop macrodantin 04/27/2018 with esr 20  rx   pred x 12 days > complete resolution of all symptoms   - 04/17/2019  After extensive coaching inhaler device,  effectiveness =    90%  > rec try symb 160 2bid and  restart flonase> coughed thru the symb 160 so back to 80   Of the three most common causes of  Sub-acute / recurrent or chronic cough, only one (GERD)  can actually contribute to/ trigger  the other two (asthma and post nasal drip syndrome)  and perpetuate the cylce of cough.  While not intuitively obvious, many patients with chronic low grade reflux do not cough until there is a primary insult that disturbs the protective epithelial barrier and exposes sensitive nerve endings.   This is typically viral but can due to PNDS and  either may apply here.  >>>  The point is that once this occurs, it is difficult to eliminate the cycle  using anything but a maximally effective acid suppression regimen at least in the short run, accompanied by an appropriate diet to address non acid GERD and control / eliminate the cough itself for at least 3 days with codeine based cough meds and also added 12 days of Prednisone in case of component of Th-2 driven upper or lower airways inflammation (if cough responds short term only to relapse befor return while will on rx for uacs that would point to allergic rhinitis/ asthma or eos bronchitis)     I had an extended discussion with the patient reviewing all relevant studies completed to date and  lasting 15 to 20 minutes of a 25 minute visit    I performed detailed device teaching using a teach back  method which extended face to face time for this visit (see above)  Each maintenance medication was reviewed in detail including emphasizing most importantly the difference between maintenance and prns and under what circumstances the prns are to be triggered using an action plan format that is not reflected in the computer generated alphabetically organized AVS which I have not found useful in most complex patients, especially with respiratory illnesses  Please see AVS for specific instructions unique to this visit that I personally wrote and verbalized to the the pt in detail and then reviewed with pt  by my nurse highlighting any  changes in therapy recommended at today's visit to their plan of care.

## 2019-07-26 NOTE — Progress Notes (Signed)
Subjective:     Patient ID: Kristi Davies, female   DOB: October 04, 1976,   MRN: [id number]    Brief patient profile:  [id number] yowf never smoker and @ around age [age] noted poor ex tol ? EIA worse with extremes of temp and never dx or rx and post term pregancy check up ? 6 month p partum at age 89 wheezy per ob > rx flovent seemed to help then around 2014 worse on flovent and started needing regular albuterol then severe cold spring 2018 and persistent cough since > tried on on BREO made it worse, advair no better but symbiocort ? 160 strength some better but cough still triggered by smells/ perfume and needing neb up to twice a week so referred to pulmonary clinic 03/29/2018 by Dr   Zoe LanPenny Jones     History of Present Illness  03/29/2018 1st [facility] office visit/ [facility]   Chief Complaint  Patient presents with  . Pulmonary Consult    Referred by Zoe LanPenny Jones, PA. She was dxed with Asthma approx 20 yrs ago. She has had increased cough over the past year- non prod.  She also c/o SOB that comes and goes- worse with exercise and cleaning. She is using her albuterol inhaler 1 x per wk and neb with albuterol 2 x per wk on average.   still doe x nature hike up hills /eliptical x 15-20 min slow moderate but doet not provoke the cough  Also dx of sinusitis in past but no ent eval or sinus CT  Allergy shots x 6 months at [facility] never able to get above lowest strength>stopped around 2014  "peak flows are always nl Cough is dry daytime > noct  "having to use cough drops all the time". rec For cough  > tessalon 200 mg every 8 hours as needed Try symbicort 80 Take 2 puffs first thing in am and then another 2 puffs about 12 hours later.  Only use your albuterol as a rescue medication  Pantoprazole (protonix) 40 mg   Take  30-60 min before first meal of the day and Pepcid (famotidine)  20 mg one hour before   bedtime until return to office - this is the best way to tell whether stomach acid is contributing to  your problem.   GERD diet. Please see patient coordinator before you leave today  to schedule sinus CT      04/27/2018  f/u ov/[facility] re: cough on macrodantn  Chief Complaint  Patient presents with  . Follow-up    Cough and SOB have been worse since the last visit. She has had a sore throat for the past wk. Cough has been non prod. She has been using her albuterol inhaler at least 2 x daily and neb with albuterol once per wk on average.   Dyspnea:  Can't get a full breath in Cough: worse with ex Sleep: ok  SABA use:  As above, just with activity  Tessalon seems to help when takes it but presently using no more than once a day rec Avoid macrodantin/ no  Change in rx  06/10/2018  f/u ov/[facility] re: cough variant asthma on sym 80/singulair maint  Chief Complaint  Patient presents with  . Follow-up    Breathing is overall doing well. She only uses her albuterol inhaler before exercise.  Dyspnea:  Uses saba before late afternoon [employer]/ eliptical otherwise has doe Cough: none SABA use: just with ex as above  rec Work on inhaler technique  12/12/2018  f/u ov/[facility] re: cough variant asthma/ symb 80 2bid/ singualir rx  Chief Complaint  Patient presents with  . Follow-up    Breathing is doing well today. She was dxed with OSA and started on CPAP per Dr. Roxy Horseman.  She is NOT using her albuterol inhaler and neb recently.   Dyspnea:  On feet all day / no trouble with doe  Cough: none other than occ pnds Sleeping: on cpap on bed/ bed is flat SABA use: not needing  02: none rec rx nasal steroids (flonase)     04/17/2019  f/u ov/[facility] re: cough variant asthma vs uacs  flare since late March 2020  Chief Complaint  Patient presents with  . Follow-up    Still has occ cough with exertion such as taking a brisk walk. Cough is non prod. She has been using her neb daily but not her albuterol inhaler.   better on flonase but stopped it now worse  Sick since late march 2020/ husband is  paramedic similar illness  Fever on off to 102 x sev weeks then completely gone x 2 weeks  Dyspnea:  Doing eliptical, only issue cough during and after ex  Cough: no cough at hs/ no longer needing codeine / still has throat tickle daytime Sleeping: on side / bed is flat one pill- cough / breathing not waking her. SABA use: starting late march 2020 using  neb  But now only qod, skipped saba hfa as plan B 02: none   rec Resume flonase to see if tickle gets better  Plan A = Automatic = Symbicort 160 Take 2 puffs first thing in am and then another 2 puffs about 12 hours later.  Plan B = Backup Only use your albuterol inhaler as a rescue medication Plan C = Crisis - only use your albuterol nebulizer if you first try Plan B     06/21/19 virtual visit B Volanda Napoleon NP rec Prednisone 20mg  x 5 days  Refill codeine cough syrup - use as needed with safe precautions  Resume flonase 1 spray per nostril daily for 2 weeks Stay well hydrated Monitor temperature and O2 daily    07/26/2019  f/u ov/[facility]ert re:  Cough since early march 2020, only  better on while on prednisone / no better on high dose symb Chief Complaint  Patient presents with  . Follow-up    Patient reports that she still has a bad cough. She reports that at times she coughs so much that he vomitts. She also reports a burning in her chest at the time of coughing.   Dyspnea:  Not limited by breathing from desired activities  But triggers cough  Cough: on insp  Sleeping: ok on cpap  SABA use: helps  For maybe 30 min  02: no gen ant cp during coughing fits only    No obvious day to day or daytime variability or assoc excess/ purulent sputum or mucus plugs or hemoptysis chest tightness, subjective wheeze or overt sinus or hb symptoms.   Sleeping as above  without nocturnal  or early am exacerbation  of respiratory  c/o's or need for noct saba. Also denies any obvious fluctuation of symptoms with weather or environmental changes or other  aggravating or alleviating factors except as outlined above   No unusual exposure hx or h/o childhood pna/ asthma or knowledge of premature birth.  Current Allergies, Complete Past Medical History, Past Surgical History, Family History, and Social History were reviewed in [employer]  medical record.  ROS  The following are not active complaints unless bolded Hoarseness, sore throat, dysphagia, dental problems, itching, sneezing,  nasal congestion or discharge of excess mucus or purulent secretions, ear ache,   fever, chills, sweats, unintended wt loss or wt gain, classically pleuritic or exertional cp,  orthopnea pnd or arm/hand swelling  or leg swelling, presyncope, palpitations, abdominal pain, anorexia, nausea, vomiting, diarrhea  or change in bowel habits or change in bladder habits, change in stools or change in urine, dysuria, hematuria,  rash, arthralgias, visual complaints, headache, numbness, weakness or ataxia or problems with walking or coordination,  change in mood or  memory.        Current Meds  Medication Sig  . albuterol (PROVENTIL) (2.5 MG/3ML) 0.083% nebulizer solution 1 vial in neb every 6 hours as needed  . albuterol (VENTOLIN HFA) 108 (90 Base) MCG/ACT inhaler Inhale 1-2 puffs into the lungs every 6 (six) hours as needed for wheezing or shortness of breath.  . benzonatate (TESSALON) 200 MG capsule TAKE 1 CAPSULE BY MOUTH EVERY DAY 3 TIMES A DAY AS NEEDED FOR COUGH  . budesonide-formoterol (SYMBICORT) 80-4.5 MCG/ACT inhaler .Take 2 puffs first thing in am and then another 2 puffs about 12 hours later.  . clonazePAM (KLONOPIN) 0.5 MG tablet TAKE 1 TABLET BY MOUTH 2 TIMES DAILY AS NEEDED FOR ANXIETY  . cyclobenzaprine (FLEXERIL) 10 MG tablet Take 10 mg by mouth 3 (three) times daily as needed for muscle spasms.  Marland Kitchen. guaiFENesin-codeine 100-10 MG/5ML syrup Take 5 mLs by mouth every 6 (six) hours as needed for cough.  . lamoTRIgine (LAMICTAL) 150 MG tablet Take 150 mg  by mouth daily.  Marland Kitchen. levocetirizine (XYZAL) 5 MG tablet Take 5 mg by mouth every evening.  . modafinil (PROVIGIL) 200 MG tablet Start out by taking 1/2 tablet upon awakening. If after 7 days you find you tolerate the medication and feel that you need additional help you can increase to full tablet.  . montelukast (SINGULAIR) 10 MG tablet Take 10 mg by mouth at bedtime.  Marland Kitchen. omeprazole (PRILOSEC) 20 MG capsule Take 20 mg by mouth daily.  . traZODone (DESYREL) 100 MG tablet Take 100 mg by mouth at bedtime.  Marland Kitchen. UNABLE TO FIND Med Name: CPAP  . zaleplon (SONATA) 10 MG capsule TAKE 1 CAPSULE BY MOUTH AT BEDTIME AS NEEDED SLEEP                     Objective:   Physical Exam      07/26/2019        193  04/17/2019        190  12/12/2018          182  06/10/2018          187   04/27/2018       187   03/29/18 187 lb 3.2 oz (84.9 kg)  11/12/17 193 lb 12.8 oz (87.9 kg)     Vital signs reviewed - Note on arrival 02 sats  98% on RA      amb wf dry hacking cough on early insp reproducibly    HEENT: nl dentition, turbinates bilaterally, and oropharynx. Nl external ear canals without cough reflex   NECK :  without JVD/Nodes/TM/ nl carotid upstrokes bilaterally   LUNGS: no acc muscle use,  Nl contour chest which is clear to A and P bilaterally     CV:  RRR  no s3 or murmur or increase in P2, and  no edema   ABD:  soft and nontender with nl inspiratory excursion in the supine position. No bruits or organomegaly appreciated, bowel sounds nl  MS:  Nl gait/ ext warm without deformities, calf tenderness, cyanosis or clubbing No obvious joint restrictions   SKIN: warm and dry without lesions    NEURO:  alert, approp, nl sensorium with  no motor or cerebellar deficits apparent.         CXR PA and Lateral:   07/26/2019 :    I personally reviewed images and agree with radiology impression as follows:    No active cardiopulmonary disease.    Assessment:

## 2019-07-26 NOTE — Assessment & Plan Note (Signed)
Onset late march 2020   - Covid 19 serology 04/17/2019 = neg  - Covid PCR  06/08/2019  Pos (care everywhere) / husband also sick (paramedic)  Suspect multiple viruses the most recent covid 59 with no evidence of ongoing infection so standard restrictions only as we don't have proof yet that she can't catch it again.

## 2019-07-27 ENCOUNTER — Telehealth: Payer: Self-pay | Admitting: Internal Medicine

## 2019-07-27 ENCOUNTER — Encounter: Payer: Self-pay | Admitting: Internal Medicine

## 2019-07-27 NOTE — Telephone Encounter (Signed)
Pt returning call and can be reached back @ [phone].Kristi Davies

## 2019-07-27 NOTE — Telephone Encounter (Signed)
Pt has received results of cxr.  Pt states pharmacy did not fill rx of #30 sent. Pt states rx received for #20 2 tabs x 5 days. Pt took 2 tabs and has 8 left on this rx. Called pharmacy and asked them to verifiy rx. MW sent incorrect rx but changed to 2nd rx. Pts insurance will not cover again until 8/22. Contacted patient and made aware. She says she will pay out of pocket if she has to b/c she wants to take as MW prescribed. 4 tabs 3 d 3 tabs 3 d 1 tab 3 d.  Nothing further needed.

## 2019-07-27 NOTE — Telephone Encounter (Signed)
LMTCB  CXR results: Notes recorded by Tanda Rockers, MD on 07/26/2019 at 1:28 PM EDT  Call pt: Reviewed cxr and no acute change so no change in recommendations made at ov   Prednisone 10 mg: #30 sent Prednisone 10 mg Take 4 for three days 3 for three days 2 for three days 1 for three days and stop   4 tabs x 3 days 3 tabs x 3 days 1 tab x 3 days

## 2019-08-21 ENCOUNTER — Other Ambulatory Visit: Payer: Self-pay | Admitting: Neurology

## 2019-08-21 NOTE — Telephone Encounter (Signed)
[place] Database Verified LR: 07-15-2019 Qty: 30 Pending appointment: No pending appt

## 2019-08-21 NOTE — Telephone Encounter (Signed)
Modafinil is refilled for 6 month at a time.

## 2019-11-07 HISTORY — PX: TRIGGER FINGER RELEASE: SHX641

## 2019-12-02 ENCOUNTER — Other Ambulatory Visit: Payer: Self-pay | Admitting: Neurology

## 2019-12-13 ENCOUNTER — Ambulatory Visit: Payer: 59 | Admitting: Internal Medicine

## 2020-01-09 ENCOUNTER — Ambulatory Visit: Payer: 59 | Admitting: Internal Medicine

## 2020-01-09 ENCOUNTER — Encounter: Payer: Self-pay | Admitting: Internal Medicine

## 2020-01-09 ENCOUNTER — Other Ambulatory Visit: Payer: Self-pay

## 2020-01-09 DIAGNOSIS — J45991 Cough variant asthma: Secondary | ICD-10-CM

## 2020-01-09 MED ORDER — BENZONATATE 200 MG PO CAPS: 200.0000 mg | ORAL_CAPSULE | Freq: Three times a day (TID) | ORAL | 2 refills | Status: AC | PRN

## 2020-01-09 MED ORDER — GUAIFENESIN-CODEINE 100-10 MG/5ML PO SOLN: 5.0000 mL | Freq: Four times a day (QID) | ORAL | 0 refills | Status: DC | PRN

## 2020-01-09 NOTE — Progress Notes (Signed)
Subjective:     Patient ID: Kristi Davies, female   DOB: 1976-03-21,   MRN: [id number]    Brief patient profile:  44  yowf never smoker and @ around age [age] noted poor ex tol ? EIA worse with extremes of temp and never dx or rx and post term pregancy check up ? 6 month p partum at age 44 wheezy per ob > rx flovent seemed to help then around 2014 worse on flovent and started needing regular albuterol then severe cold spring 2018 and persistent cough since > tried on on BREO made it worse, advair no better but symbiocort ? 160 strength some better but cough still triggered by smells/ perfume and needing neb up to twice a week so referred to pulmonary clinic 03/29/2018 by Dr   Zoe Lan     History of Present Illness  03/29/2018 1st [facility] office visit/ Kristi Davies   Chief Complaint  Patient presents with  . Pulmonary Consult    Referred by Zoe Lan, PA. She was dxed with Asthma approx 20 yrs ago. She has had increased cough over the past year- non prod.  She also c/o SOB that comes and goes- worse with exercise and cleaning. She is using her albuterol inhaler 1 x per wk and neb with albuterol 2 x per wk on average.   still doe x nature hike up hills /eliptical x 15-20 min slow moderate but doet not provoke the cough  Also dx of sinusitis in past but no ent eval or sinus CT  Allergy shots x 6 months at [facility] never able to get above lowest strength>stopped around 2014  "peak flows are always nl Cough is dry daytime > noct  "having to use cough drops all the time". rec For cough  > tessalon 200 mg every 8 hours as needed Try symbicort 80 Take 2 puffs first thing in am and then another 2 puffs about 12 hours later.  Only use your albuterol as a rescue medication  Pantoprazole (protonix) 40 mg   Take  30-60 min before first meal of the day and Pepcid (famotidine)  20 mg one hour before   bedtime until return to office - this is the best way to tell whether stomach acid is contributing to  your problem.   GERD diet. Please see patient coordinator before you leave today  to schedule sinus CT      04/27/2018  f/u ov/Kristi Davies re: cough on macrodantn  Chief Complaint  Patient presents with  . Follow-up    Cough and SOB have been worse since the last visit. She has had a sore throat for the past wk. Cough has been non prod. She has been using her albuterol inhaler at least 2 x daily and neb with albuterol once per wk on average.   Dyspnea:  Can't get a full breath in Cough: worse with ex Sleep: ok  SABA use:  As above, just with activity  Tessalon seems to help when takes it but presently using no more than once a day rec Avoid macrodantin/ no  Change in rx  06/10/2018  f/u ov/Kristi Davies re: cough variant asthma on sym 80/singulair maint  Chief Complaint  Patient presents with  . Follow-up    Breathing is overall doing well. She only uses her albuterol inhaler before exercise.  Dyspnea:  Uses saba before late afternoon [employer]/ eliptical otherwise has doe Cough: none SABA use: just with ex as above  rec Work on inhaler technique  12/12/2018  f/u ov/Kristi Davies re: cough variant asthma/ symb 80 2bid/ singualir rx  Chief Complaint  Patient presents with  . Follow-up    Breathing is doing well today. She was dxed with OSA and started on CPAP per Dr. Golden Hurter.  She is NOT using her albuterol inhaler and neb recently.   Dyspnea:  On feet all day / no trouble with doe  Cough: none other than occ pnds Sleeping: on cpap on bed/ bed is flat SABA use: not needing  02: none rec rx nasal steroids (flonase)     04/17/2019  f/u ov/Kristi Davies re: cough variant asthma vs uacs  flare since late March 2020  Chief Complaint  Patient presents with  . Follow-up    Still has occ cough with exertion such as taking a brisk walk. Cough is non prod. She has been using her neb daily but not her albuterol inhaler.   better on flonase but stopped it now worse  Sick since late march 2020/ husband is  paramedic similar illness  Fever on off to 102 x sev weeks then completely gone x 2 weeks  Dyspnea:  Doing eliptical, only issue cough during and after ex  Cough: no cough at hs/ no longer needing codeine / still has throat tickle daytime Sleeping: on side / bed is flat one pill- cough / breathing not waking her. SABA use: starting late march 2020 using  neb  But now only qod, skipped saba hfa as plan B 02: none   rec Resume flonase to see if tickle gets better  Plan A = Automatic = Symbicort 160 Take 2 puffs first thing in am and then another 2 puffs about 12 hours later.  Plan B = Backup Only use your albuterol inhaler as a rescue medication Plan C = Crisis - only use your albuterol nebulizer if you first try Plan B     06/21/19 virtual visit B Clent Ridges NP rec Prednisone 20mg  x 5 days  Refill codeine cough syrup - use as needed with safe precautions  Resume flonase 1 spray per nostril daily for 2 weeks Stay well hydrated Monitor temperature and O2 daily    07/26/2019  f/u ov/Kristi Davies re:  Cough since early march 2020, only  better on while on prednisone / no better on high dose symb Chief Complaint  Patient presents with  . Follow-up    Patient reports that she still has a bad cough. She reports that at times she coughs so much that he vomitts. She also reports a burning in her chest at the time of coughing.   Dyspnea:  Not limited by breathing from desired activities  But triggers cough  Cough: on insp  Sleeping: ok on cpap  SABA use: helps  For maybe 30 min  02: no gen ant cp during coughing fits only Re Prednisone 10 mg Take 4 for three days 3 for three days 2 for three days 1 for three days and stop  If can't stop cough>  Take the codeine every 4 hours and then taper over 3 days  Anytime your start coughing for   any reason >  protonix 40 mg Take 30- 60 min before your first and last meals of the day until no cough at all off all cough meds x 1 week     01/09/2020  f/u ov/Kristi Davies  re: cough variant asthma vs uacs  Chief Complaint  Patient presents with  . Follow-up    Breathing is overall doing well.  She has occ cough that she notices when temp changes- non prod. She uses her albuterol 1-2 x per wk.   Dyspnea:  No regular ex / mostly walking dogs  Cough: extremes of cold or dry heat Sleeping: no resp cc's on cpap  SABA use: once or twice weekly sym 80 2bid and singulair  02: none    No obvious day to day or daytime variability or assoc excess/ purulent sputum or mucus plugs or hemoptysis or cp or chest tightness, subjective wheeze or overt sinus or hb symptoms.   Sleeping as above  without nocturnal  or early am exacerbation  of respiratory  c/o's or need for noct saba. Also denies any obvious fluctuation of symptoms with weather or environmental changes or other aggravating or alleviating factors except as outlined above   No unusual exposure hx or h/o childhood pna/ asthma or knowledge of premature birth.  Current Allergies, Complete Past Medical History, Past Surgical History, Family History, and Social History were reviewed in [employer] record.  ROS  The following are not active complaints unless bolded Hoarseness, sore throat, dysphagia, dental problems, itching, sneezing,  nasal congestion or discharge of excess mucus or purulent secretions, ear ache,   fever, chills, sweats, unintended wt loss or wt gain, classically pleuritic or exertional cp,  orthopnea pnd or arm/hand swelling  or leg swelling, presyncope, palpitations, abdominal pain, anorexia, nausea, vomiting, diarrhea  or change in bowel habits or change in bladder habits, change in stools or change in urine, dysuria, hematuria,  rash, arthralgias, visual complaints, headache, numbness, weakness or ataxia or problems with walking or coordination,  change in mood or  memory.        Current Meds  Medication Sig  . albuterol (PROVENTIL) (2.5 MG/3ML) 0.083% nebulizer solution 1 vial  in neb every 6 hours as needed  . albuterol (VENTOLIN HFA) 108 (90 Base) MCG/ACT inhaler Inhale 1-2 puffs into the lungs every 6 (six) hours as needed for wheezing or shortness of breath.  . benzonatate (TESSALON) 200 MG capsule TAKE 1 CAPSULE BY MOUTH EVERY DAY 3 TIMES A DAY AS NEEDED FOR COUGH  . budesonide-formoterol (SYMBICORT) 80-4.5 MCG/ACT inhaler .Take 2 puffs first thing in am and then another 2 puffs about 12 hours later.  . clonazePAM (KLONOPIN) 0.5 MG tablet TAKE 1 TABLET BY MOUTH 2 TIMES DAILY AS NEEDED FOR ANXIETY  . cyclobenzaprine (FLEXERIL) 10 MG tablet Take 10 mg by mouth 3 (three) times daily as needed for muscle spasms.  Marland Kitchen guaiFENesin-codeine 100-10 MG/5ML syrup Take 5 mLs by mouth every 6 (six) hours as needed for cough.  . lamoTRIgine (LAMICTAL) 150 MG tablet Take 150 mg by mouth daily.  Marland Kitchen levocetirizine (XYZAL) 5 MG tablet Take 5 mg by mouth every evening.  . modafinil (PROVIGIL) 200 MG tablet TAKE 1/2 TAB WHEN WAKING. IF TOLERATED AFTER 7 DAYS AND FEEL YOU NEED ADDITIONAL HELP YOU CAN INCREASE TO 1 TAB DAILY  . montelukast (SINGULAIR) 10 MG tablet Take 10 mg by mouth at bedtime.  . pantoprazole (PROTONIX) 40 MG tablet Take 30- 60 min before your first and last meals of the day (Patient taking differently: Take 30- 60 min before your first and last meals of the day as needed)  . traZODone (DESYREL) 100 MG tablet Take 100 mg by mouth at bedtime.  Marland Kitchen UNABLE TO FIND Med Name: CPAP  . zaleplon (SONATA) 10 MG capsule TAKE 1 CAPSULE BY MOUTH AT BEDTIME AS NEEDED FOR SLEEP  Objective:   Physical Exam     01/09/2020         202  07/26/2019        193  04/17/2019        190  12/12/2018          182  06/10/2018          187   04/27/2018       187   03/29/18 187 lb 3.2 oz (84.9 kg)  11/12/17 193 lb 12.8 oz (87.9 kg)     Vital signs reviewed  01/09/2020  - Note at rest 02 sats  97% on RA      HEENT : pt wearing mask not removed for exam due to covid -19  concerns.    NECK :  without JVD/Nodes/TM/ nl carotid upstrokes bilaterally   LUNGS: no acc muscle use,  Nl contour chest which is clear to A and P bilaterally without cough on insp or exp maneuvers   CV:  RRR  no s3 or murmur or increase in P2, and no edema   ABD:  soft and nontender with nl inspiratory excursion in the supine position. No bruits or organomegaly appreciated, bowel sounds nl  MS:  Nl gait/ ext warm without deformities, calf tenderness, cyanosis or clubbing No obvious joint restrictions   SKIN: warm and dry without lesions    NEURO:  alert, approp, nl sensorium with  no motor or cerebellar deficits apparent.           Assessment:

## 2020-01-09 NOTE — Patient Instructions (Addendum)
For cough tessalon 200 mg every 8 hours and if persist then codeine 1tsp  is your backup but if need more regular use then start protonix 40 mg Take 30- 60 min before your first and last meals of the day until gone for a week   Please schedule a follow up visit in 6 months but call sooner if needed

## 2020-01-10 ENCOUNTER — Encounter: Payer: Self-pay | Admitting: Internal Medicine

## 2020-01-10 NOTE — Assessment & Plan Note (Signed)
Onset ? As teenager - [name] 03/29/2018  =   11 on symb 160  - 03/29/2018  After extensive coaching inhaler device  effectiveness =    90% with spacer > try reduce symbicort to 80 2bid - Spirometry 03/29/2018  Nl with no curvature on symb 160  Sinus CT 04/04/18 > Well-aerated paranasal sinuses  Allergy profile 04/27/2018 >  Eos 0.1 /  IgE  29 RAST only pos dust/ roaches - Stop macrodantin 04/27/2018 with esr 20  rx   pred x 12 days > complete resolution of all symptoms   - 04/17/2019  After extensive coaching inhaler device,  effectiveness =    90%  > rec try symb 160 2bid and  restart flonase> coughed thru the symb 160 so back to 80   All goals of chronic asthma control met including optimal function and elimination of symptoms with minimal need for rescue therapy.  Contingencies discussed in full including contacting this office immediately if not controlling the symptoms using the rule of two's.     F/u q 6 m   Pt informed of the seriousness of COVID 19 infection as a direct risk to lung health  and safey and to close contacts and should continue to wear a facemask in public and minimize exposure to public locations but especially avoid any area or activity where non-close contacts are not observing distancing or wearing an appropriate face mask.  I strongly recommended vaccine when offered.

## 2020-01-15 ENCOUNTER — Other Ambulatory Visit (HOSPITAL_COMMUNITY): Payer: 59

## 2020-01-16 NOTE — Patient Instructions (Addendum)
YOU ARE SCHEDULED FOR A COVID TEST _Friday 02/12/2021________@___200  pm_________. THIS TEST MUST BE DONE BEFORE SURGERY. GO TO  [address], [place], [address] AND REMAIN IN YOUR CAR, THIS IS A DRIVE UP TEST. ONCE YOUR COVID TEST IS DONE PLEASE FOLLOW ALL THE QUARANTINE  INSTRUCTIONS GIVEN IN YOUR HANDOUT.      Your procedure is scheduled on Tuesday 01/23/2020   Report to [facility] AT 0600   A. M.   Call this number if you have problems the morning of surgery  :[phone].   OUR ADDRESS IS [address].  WE ARE LOCATED IN [facility].                                     REMEMBER:  DO NOT EAT FOOD OR DRINK LIQUIDS AFTER MIDNIGHT .    YOU MAY  BRUSH YOUR TEETH MORNING OF SURGERY AND RINSE YOUR MOUTH OUT, NO CHEWING GUM CANDY OR MINTS.    TAKE THESE MEDICATIONS MORNING OF SURGERY WITH A SIP OF WATER:  Lamotrigine (Lamictal), Pantoprazole (Protonix), use ALbuterol nebulizer if needed and bring inhaler with you to the hospital.inhaler if needed and   IF YOU ARE SPENDING THE NIGHT AFTER SURGERY PLEASE BRING ALL YOUR PRESCRIPTION MEDICATIONS IN THEIR ORIGINAL BOTTLES. 1 VISITOR IS ALLOWED IN WAITING ROOM ONLY DAY OF SURGERY. NO VISITOR MAY SPEND THE NIGHT. VISITOR ARE ALLOWED TO STAY UNTIL 800 PM.                                    DO NOT WEAR JEWERLY, MAKE UP, OR NAIL POLISH ON FINGERNAILS. DO NOT WEAR LOTIONS, POWDERS, PERFUMES OR DEODORANT. DO NOT SHAVE FOR 24 HOURS PRIOR TO DAY OF SURGERY.  CONTACTS, GLASSES, OR DENTURES MAY NOT BE WORN TO SURGERY.                                    [facility] IS NOT RESPONSIBLE  FOR ANY BELONGINGS.                                                                    Marland Kitchen                                                                                                    [facility] - Preparing for Surgery Before surgery, you can play an important role.  Because skin is not sterile, your skin needs to be  as free of germs as possible.  You can reduce the number of germs on your skin by washing with CHG (  chlorahexidine gluconate) soap before surgery.  CHG is an antiseptic cleaner which kills germs and bonds with the skin to continue killing germs even after washing. Please DO NOT use if you have an allergy to CHG or antibacterial soaps.  If your skin becomes reddened/irritated stop using the CHG and inform your nurse when you arrive at Short Stay. Do not shave (including legs and underarms) for at least 48 hours prior to the first CHG shower.  You may shave your face/neck. Please follow these instructions carefully:  1.  Shower with CHG Soap the night before surgery and the  morning of Surgery.  2.  If you choose to wash your hair, wash your hair first as usual with your  normal  shampoo.  3.  After you shampoo, rinse your hair and body thoroughly to remove the  shampoo.                           4.  Use CHG as you would any other liquid soap.  You can apply chg directly  to the skin and wash                       Gently with a scrungie or clean washcloth.  5.  Apply the CHG Soap to your body ONLY FROM THE NECK DOWN.   Do not use on face/ open                           Wound or open sores. Avoid contact with eyes, ears mouth and genitals (private parts).                       Wash face,  Genitals (private parts) with your normal soap.             6.  Wash thoroughly, paying special attention to the area where your surgery  will be performed.  7.  Thoroughly rinse your body with warm water from the neck down.  8.  DO NOT shower/wash with your normal soap after using and rinsing off  the CHG Soap.                9.  Pat yourself dry with a clean towel.            10.  Wear clean pajamas.            11.  Place clean sheets on your bed the night of your first shower and do not  sleep with pets. Day of Surgery : Do not apply any lotions/deodorants the morning of surgery.  Please wear clean clothes to the  hospital/surgery center.  FAILURE TO FOLLOW THESE INSTRUCTIONS MAY RESULT IN THE CANCELLATION OF YOUR SURGERY PATIENT SIGNATURE_________________________________  NURSE SIGNATURE__________________________________  ________________________________________________________________________   Kristi Davies  An incentive spirometer is a tool that can help keep your lungs clear and active. This tool measures how well you are filling your lungs with each breath. Taking long deep breaths may help reverse or decrease the chance of developing breathing (pulmonary) problems (especially infection) following:  A long period of time when you are unable to move or be active. BEFORE THE PROCEDURE   If the spirometer includes an indicator to show your best effort, your nurse or respiratory therapist will set it to a desired goal.  If possible, sit up straight or lean slightly  forward. Try not to slouch.  Hold the incentive spirometer in an upright position. INSTRUCTIONS FOR USE  1. Sit on the edge of your bed if possible, or sit up as far as you can in bed or on a chair. 2. Hold the incentive spirometer in an upright position. 3. Breathe out normally. 4. Place the mouthpiece in your mouth and seal your lips tightly around it. 5. Breathe in slowly and as deeply as possible, raising the piston or the ball toward the top of the column. 6. Hold your breath for 3-5 seconds or for as long as possible. Allow the piston or ball to fall to the bottom of the column. 7. Remove the mouthpiece from your mouth and breathe out normally. 8. Rest for a few seconds and repeat Steps 1 through 7 at least 10 times every 1-2 hours when you are awake. Take your time and take a few normal breaths between deep breaths. 9. The spirometer may include an indicator to show your best effort. Use the indicator as a goal to work toward during each repetition. 10. After each set of 10 deep breaths, practice coughing to be sure  your lungs are clear. If you have an incision (the cut made at the time of surgery), support your incision when coughing by placing a pillow or rolled up towels firmly against it. Once you are able to get out of bed, walk around indoors and cough well. You may stop using the incentive spirometer when instructed by your caregiver.  RISKS AND COMPLICATIONS  Take your time so you do not get dizzy or light-headed.  If you are in pain, you may need to take or ask for pain medication before doing incentive spirometry. It is harder to take a deep breath if you are having pain. AFTER USE  Rest and breathe slowly and easily.  It can be helpful to keep track of a log of your progress. Your caregiver can provide you with a simple table to help with this. If you are using the spirometer at home, follow these instructions: [place] IF:   You are having difficultly using the spirometer.  You have trouble using the spirometer as often as instructed.  Your pain medication is not giving enough relief while using the spirometer.  You develop fever of 100.5 F (38.1 C) or higher. SEEK IMMEDIATE MEDICAL CARE IF:   You cough up bloody sputum that had not been present before.  You develop fever of 102 F (38.9 C) or greater.  You develop worsening pain at or near the incision site. MAKE SURE YOU:   Understand these instructions.  Will watch your condition.  Will get help right away if you are not doing well or get worse. Document Released: 04/05/2007 Document Revised: 02/15/2012 Document Reviewed: 06/06/2007 [facility] Patient Information 2014 [place], [place].   ________________________________________________________________________

## 2020-01-17 ENCOUNTER — Encounter (HOSPITAL_COMMUNITY)
Admission: RE | Admit: 2020-01-17 | Discharge: 2020-01-17 | Disposition: A | Discharge status: Home / Self Care | Payer: 59 | Source: Ambulatory Visit | Attending: Obstetrics and Gynecology | Admitting: Obstetrics and Gynecology

## 2020-01-17 ENCOUNTER — Other Ambulatory Visit (HOSPITAL_COMMUNITY): Payer: 59

## 2020-01-17 ENCOUNTER — Other Ambulatory Visit: Payer: Self-pay

## 2020-01-17 ENCOUNTER — Encounter (HOSPITAL_COMMUNITY): Payer: Self-pay

## 2020-01-17 DIAGNOSIS — Z01812 Encounter for preprocedural laboratory examination: Secondary | ICD-10-CM | POA: Insufficient documentation

## 2020-01-17 HISTORY — DX: Other complications of anesthesia, initial encounter: T88.59XA

## 2020-01-17 HISTORY — DX: Gastro-esophageal reflux disease without esophagitis: K21.9

## 2020-01-17 HISTORY — DX: Interstitial cystitis (chronic) without hematuria: N30.10

## 2020-01-17 LAB — CBC
HCT: 41.3 % (ref 36.0–46.0)
Hemoglobin: 13.4 g/dL (ref 12.0–15.0)
MCH: 29.6 pg (ref 26.0–34.0)
MCHC: 32.4 g/dL (ref 30.0–36.0)
MCV: 91.4 fL (ref 80.0–100.0)
Platelets: 258 10*3/uL (ref 150–400)
RBC: 4.52 MIL/uL (ref 3.87–5.11)
RDW: 12.3 % (ref 11.5–15.5)
WBC: 9.8 10*3/uL (ref 4.0–10.5)
nRBC: 0 % (ref 0.0–0.2)

## 2020-01-17 NOTE — Progress Notes (Signed)
LM on VM [phone] to call back to go over medical history and go over instructions for surgery.

## 2020-01-17 NOTE — Progress Notes (Addendum)
PCP - Zoe Lan, NP   [facility] Cardiologist - many years ago-2018 saw Dr. Nanetta Batty for chest discomfort and palpitations-negative results Neurologist- Dr. Porfirio Mylar Dohmeier  LOV 03/23/2019 televisit  For OSA  Chest x-ray - 07/26/2019 epic EKG - n/a Stress Test -04/16/2017  epic  ECHO - 02/26/2017 epic Cardiac Cath - n/a  Sleep Study - 01/2019 Dr. Vickey Huger epic CPAP - yes  Fasting Blood Sugar - n/a Checks Blood Sugar __0___ times a day  Blood Thinner Instructions:n/a Aspirin Instructions:n/a Last Dose:n/a  Anesthesia review:   Patient has a history of asthma, PCOS, palpitations and OSA and on CPAP.  Patient denies shortness of breath, fever, cough and chest pain at PAT appointment   Patient verbalized understanding of instructions that were given to them at the PAT appointment. Patient was also instructed that they will need to review over the PAT instructions again at home before surgery.

## 2020-01-19 ENCOUNTER — Other Ambulatory Visit (HOSPITAL_COMMUNITY)
Admission: RE | Admit: 2020-01-19 | Discharge: 2020-01-19 | Disposition: A | Discharge status: Home / Self Care | Payer: 59 | Source: Ambulatory Visit | Attending: Obstetrics and Gynecology | Admitting: Obstetrics and Gynecology

## 2020-01-19 DIAGNOSIS — Z20822 Contact with and (suspected) exposure to covid-19: Secondary | ICD-10-CM | POA: Insufficient documentation

## 2020-01-19 DIAGNOSIS — Z01812 Encounter for preprocedural laboratory examination: Secondary | ICD-10-CM | POA: Insufficient documentation

## 2020-01-19 LAB — SARS CORONAVIRUS 2 (TAT 6-24 HRS): SARS Coronavirus 2: NEGATIVE

## 2020-01-22 ENCOUNTER — Encounter (HOSPITAL_BASED_OUTPATIENT_CLINIC_OR_DEPARTMENT_OTHER): Payer: Self-pay | Admitting: Obstetrics and Gynecology

## 2020-01-22 NOTE — Anesthesia Preprocedure Evaluation (Addendum)
Anesthesia Evaluation  Patient identified by MRN, date of birth, ID band Patient awake    Reviewed: Allergy & Precautions, NPO status , Patient's Chart, lab work & pertinent test results  History of Anesthesia Complications (+) PROLONGED EMERGENCE and history of anesthetic complications  Airway Mallampati: III  TM Distance: >3 FB Neck ROM: Full    Dental no notable dental hx. (+) Teeth Intact, Dental Advisory Given   Pulmonary asthma , sleep apnea and Continuous Positive Airway Pressure Ventilation , pneumonia, resolved,    Pulmonary exam normal breath sounds clear to auscultation       Cardiovascular + DOE  negative cardio ROS Normal cardiovascular exam Rhythm:Regular Rate:Normal     Neuro/Psych  Headaches, PSYCHIATRIC DISORDERS Depression Bipolar Disorder    GI/Hepatic Neg liver ROS, GERD  Medicated and Controlled,IBS   Endo/Other  Obesity  Renal/GU negative Renal ROS Bladder dysfunction  SUI Interstitial cystitis    Musculoskeletal negative musculoskeletal ROS (+)   Abdominal (+) + obese,   Peds  Hematology   Anesthesia Other Findings   Reproductive/Obstetrics Pelvic pain AUB Menorrhagia                           Anesthesia Physical Anesthesia Plan  ASA: II  Anesthesia Plan: General   Post-op Pain Management:    Induction: Intravenous  PONV Risk Score and Plan: 4 or greater and Ondansetron, Dexamethasone, Treatment may vary due to age or medical condition, Scopolamine patch - Pre-op and Midazolam  Airway Management Planned: Oral ETT  Additional Equipment:   Intra-op Plan:   Post-operative Plan: Extubation in OR  Informed Consent: I have reviewed the patients History and Physical, chart, labs and discussed the procedure including the risks, benefits and alternatives for the proposed anesthesia with the patient or authorized representative who has indicated his/her  understanding and acceptance.     Dental advisory given  Plan Discussed with: CRNA and Surgeon  Anesthesia Plan Comments:        Anesthesia Quick Evaluation

## 2020-01-23 ENCOUNTER — Ambulatory Visit (HOSPITAL_BASED_OUTPATIENT_CLINIC_OR_DEPARTMENT_OTHER): Payer: 59 | Admitting: Physician Assistant

## 2020-01-23 ENCOUNTER — Encounter (HOSPITAL_BASED_OUTPATIENT_CLINIC_OR_DEPARTMENT_OTHER): Payer: Self-pay | Admitting: Obstetrics and Gynecology

## 2020-01-23 ENCOUNTER — Other Ambulatory Visit: Payer: Self-pay

## 2020-01-23 ENCOUNTER — Ambulatory Visit (HOSPITAL_BASED_OUTPATIENT_CLINIC_OR_DEPARTMENT_OTHER): Payer: 59 | Admitting: Anesthesiology

## 2020-01-23 ENCOUNTER — Encounter (HOSPITAL_BASED_OUTPATIENT_CLINIC_OR_DEPARTMENT_OTHER)
Admission: RE | Disposition: A | Discharge status: Home / Self Care | Payer: Self-pay | Source: Home / Self Care | Attending: Obstetrics and Gynecology

## 2020-01-23 ENCOUNTER — Ambulatory Visit (HOSPITAL_BASED_OUTPATIENT_CLINIC_OR_DEPARTMENT_OTHER)
Admission: RE | Admit: 2020-01-23 | Discharge: 2020-01-23 | Disposition: A | Discharge status: Home / Self Care | Payer: 59 | Attending: Obstetrics and Gynecology | Admitting: Obstetrics and Gynecology

## 2020-01-23 DIAGNOSIS — Z6835 Body mass index (BMI) 35.0-35.9, adult: Secondary | ICD-10-CM | POA: Diagnosis not present

## 2020-01-23 DIAGNOSIS — K219 Gastro-esophageal reflux disease without esophagitis: Secondary | ICD-10-CM | POA: Diagnosis not present

## 2020-01-23 DIAGNOSIS — N939 Abnormal uterine and vaginal bleeding, unspecified: Secondary | ICD-10-CM | POA: Diagnosis present

## 2020-01-23 DIAGNOSIS — Z7951 Long term (current) use of inhaled steroids: Secondary | ICD-10-CM | POA: Diagnosis not present

## 2020-01-23 DIAGNOSIS — Z79899 Other long term (current) drug therapy: Secondary | ICD-10-CM | POA: Diagnosis not present

## 2020-01-23 DIAGNOSIS — G4700 Insomnia, unspecified: Secondary | ICD-10-CM | POA: Diagnosis not present

## 2020-01-23 DIAGNOSIS — R102 Pelvic and perineal pain: Secondary | ICD-10-CM | POA: Diagnosis not present

## 2020-01-23 DIAGNOSIS — G4730 Sleep apnea, unspecified: Secondary | ICD-10-CM | POA: Diagnosis not present

## 2020-01-23 DIAGNOSIS — N800 Endometriosis of uterus: Secondary | ICD-10-CM | POA: Diagnosis not present

## 2020-01-23 DIAGNOSIS — J45909 Unspecified asthma, uncomplicated: Secondary | ICD-10-CM | POA: Diagnosis not present

## 2020-01-23 DIAGNOSIS — F319 Bipolar disorder, unspecified: Secondary | ICD-10-CM | POA: Insufficient documentation

## 2020-01-23 DIAGNOSIS — E669 Obesity, unspecified: Secondary | ICD-10-CM | POA: Diagnosis not present

## 2020-01-23 DIAGNOSIS — Z90710 Acquired absence of both cervix and uterus: Secondary | ICD-10-CM | POA: Diagnosis present

## 2020-01-23 DIAGNOSIS — N920 Excessive and frequent menstruation with regular cycle: Secondary | ICD-10-CM | POA: Diagnosis present

## 2020-01-23 HISTORY — PX: VAGINAL HYSTERECTOMY: SHX2639

## 2020-01-23 HISTORY — PX: CYSTOSCOPY: SHX5120

## 2020-01-23 LAB — TYPE AND SCREEN
ABO/RH(D): A NEG
Antibody Screen: NEGATIVE

## 2020-01-23 LAB — ABO/RH: ABO/RH(D): A NEG

## 2020-01-23 LAB — POCT PREGNANCY, URINE: Preg Test, Ur: NEGATIVE

## 2020-01-23 SURGERY — HYSTERECTOMY, VAGINAL
Anesthesia: General | Site: Vagina

## 2020-01-23 MED ORDER — OXYCODONE HCL 5 MG PO TABS
ORAL_TABLET | ORAL | Status: AC
  Filled 2020-01-23: qty 1

## 2020-01-23 MED ORDER — MOMETASONE FURO-FORMOTEROL FUM 100-5 MCG/ACT IN AERO
2.0000 | INHALATION_SPRAY | Freq: Two times a day (BID) | RESPIRATORY_TRACT | Status: DC
  Filled 2020-01-23: qty 8.8

## 2020-01-23 MED ORDER — ALBUTEROL SULFATE HFA 108 (90 BASE) MCG/ACT IN AERS
1.0000 | INHALATION_SPRAY | Freq: Four times a day (QID) | RESPIRATORY_TRACT | Status: DC | PRN
  Filled 2020-01-23: qty 6.7

## 2020-01-23 MED ORDER — CLONAZEPAM 0.5 MG PO TABS
0.5000 mg | ORAL_TABLET | Freq: Two times a day (BID) | ORAL | Status: DC | PRN
  Filled 2020-01-23: qty 1

## 2020-01-23 MED ORDER — ACETAMINOPHEN 500 MG PO TABS
ORAL_TABLET | ORAL | Status: AC
  Filled 2020-01-23: qty 2

## 2020-01-23 MED ORDER — KETAMINE HCL 10 MG/ML IJ SOLN
INTRAMUSCULAR | Status: DC | PRN
  Administered 2020-01-23: 40 mg via INTRAVENOUS

## 2020-01-23 MED ORDER — SIMETHICONE 80 MG PO CHEW
80.0000 mg | CHEWABLE_TABLET | Freq: Four times a day (QID) | ORAL | Status: DC | PRN
  Filled 2020-01-23: qty 1

## 2020-01-23 MED ORDER — ROCURONIUM BROMIDE 10 MG/ML (PF) SYRINGE
PREFILLED_SYRINGE | INTRAVENOUS | Status: DC | PRN
  Administered 2020-01-23: 5 mg via INTRAVENOUS
  Administered 2020-01-23: 45 mg via INTRAVENOUS

## 2020-01-23 MED ORDER — MENTHOL 3 MG MT LOZG
1.0000 | LOZENGE | OROMUCOSAL | Status: DC | PRN
  Filled 2020-01-23: qty 9

## 2020-01-23 MED ORDER — KETOROLAC TROMETHAMINE 30 MG/ML IJ SOLN
INTRAMUSCULAR | Status: DC | PRN
  Administered 2020-01-23: 30 mg via INTRAVENOUS

## 2020-01-23 MED ORDER — CEFAZOLIN SODIUM-DEXTROSE 2-4 GM/100ML-% IV SOLN
2.0000 g | INTRAVENOUS | Status: AC
  Administered 2020-01-23: 2 g via INTRAVENOUS
  Filled 2020-01-23: qty 100

## 2020-01-23 MED ORDER — HYDROMORPHONE HCL 1 MG/ML IJ SOLN
1.0000 mg | INTRAMUSCULAR | Status: DC | PRN
  Filled 2020-01-23: qty 1

## 2020-01-23 MED ORDER — LIDOCAINE 2% (20 MG/ML) 5 ML SYRINGE
INTRAMUSCULAR | Status: AC
  Filled 2020-01-23: qty 5

## 2020-01-23 MED ORDER — ONDANSETRON HCL 4 MG/2ML IJ SOLN
INTRAMUSCULAR | Status: AC
  Filled 2020-01-23: qty 2

## 2020-01-23 MED ORDER — PROPOFOL 10 MG/ML IV BOLUS
INTRAVENOUS | Status: DC | PRN
  Administered 2020-01-23: 150 mg via INTRAVENOUS

## 2020-01-23 MED ORDER — OXYCODONE HCL 5 MG/5ML PO SOLN
5.0000 mg | Freq: Once | ORAL | Status: DC | PRN
  Filled 2020-01-23: qty 5

## 2020-01-23 MED ORDER — DEXAMETHASONE SODIUM PHOSPHATE 10 MG/ML IJ SOLN
INTRAMUSCULAR | Status: AC
  Filled 2020-01-23: qty 1

## 2020-01-23 MED ORDER — MIDAZOLAM HCL 2 MG/2ML IJ SOLN
INTRAMUSCULAR | Status: AC
  Filled 2020-01-23: qty 2

## 2020-01-23 MED ORDER — ACETAMINOPHEN 500 MG PO TABS
1000.0000 mg | ORAL_TABLET | Freq: Four times a day (QID) | ORAL | Status: DC
  Administered 2020-01-23: 1000 mg via ORAL
  Filled 2020-01-23: qty 2

## 2020-01-23 MED ORDER — MONTELUKAST SODIUM 10 MG PO TABS
10.0000 mg | ORAL_TABLET | Freq: Every day | ORAL | Status: DC
  Filled 2020-01-23: qty 1

## 2020-01-23 MED ORDER — IBUPROFEN 800 MG PO TABS
800.0000 mg | ORAL_TABLET | Freq: Four times a day (QID) | ORAL | Status: DC
  Filled 2020-01-23: qty 1

## 2020-01-23 MED ORDER — ONDANSETRON HCL 4 MG/2ML IJ SOLN
4.0000 mg | Freq: Four times a day (QID) | INTRAMUSCULAR | Status: DC | PRN
  Filled 2020-01-23: qty 2

## 2020-01-23 MED ORDER — HYDROMORPHONE HCL 1 MG/ML IJ SOLN
INTRAMUSCULAR | Status: AC
  Filled 2020-01-23: qty 1

## 2020-01-23 MED ORDER — ROCURONIUM BROMIDE 10 MG/ML (PF) SYRINGE
PREFILLED_SYRINGE | INTRAVENOUS | Status: AC
  Filled 2020-01-23: qty 10

## 2020-01-23 MED ORDER — KETOROLAC TROMETHAMINE 30 MG/ML IJ SOLN
30.0000 mg | Freq: Once | INTRAMUSCULAR | Status: AC
  Administered 2020-01-23: 30 mg via INTRAVENOUS
  Filled 2020-01-23: qty 1

## 2020-01-23 MED ORDER — GABAPENTIN 300 MG PO CAPS
ORAL_CAPSULE | ORAL | Status: AC
  Filled 2020-01-23: qty 1

## 2020-01-23 MED ORDER — MIDAZOLAM HCL 2 MG/2ML IJ SOLN
INTRAMUSCULAR | Status: DC | PRN
  Administered 2020-01-23: 2 mg via INTRAVENOUS

## 2020-01-23 MED ORDER — LACTATED RINGERS IV SOLN
INTRAVENOUS | Status: DC
  Filled 2020-01-23: qty 1000

## 2020-01-23 MED ORDER — ONDANSETRON HCL 4 MG/2ML IJ SOLN
INTRAMUSCULAR | Status: DC | PRN
  Administered 2020-01-23: 4 mg via INTRAVENOUS

## 2020-01-23 MED ORDER — FENTANYL CITRATE (PF) 100 MCG/2ML IJ SOLN
INTRAMUSCULAR | Status: DC | PRN
  Administered 2020-01-23 (×2): 50 ug via INTRAVENOUS
  Administered 2020-01-23: 150 ug via INTRAVENOUS

## 2020-01-23 MED ORDER — DEXAMETHASONE SODIUM PHOSPHATE 10 MG/ML IJ SOLN
INTRAMUSCULAR | Status: DC | PRN
  Administered 2020-01-23: 5 mg via INTRAVENOUS

## 2020-01-23 MED ORDER — SODIUM CHLORIDE 0.9 % IR SOLN
Status: DC | PRN
  Administered 2020-01-23: 1000 mL

## 2020-01-23 MED ORDER — LAMOTRIGINE 150 MG PO TABS
150.0000 mg | ORAL_TABLET | Freq: Every day | ORAL | Status: DC
  Filled 2020-01-23: qty 1

## 2020-01-23 MED ORDER — LORATADINE 10 MG PO TABS
10.0000 mg | ORAL_TABLET | Freq: Every evening | ORAL | Status: DC
  Filled 2020-01-23: qty 1

## 2020-01-23 MED ORDER — KETOROLAC TROMETHAMINE 30 MG/ML IJ SOLN
INTRAMUSCULAR | Status: AC
  Filled 2020-01-23: qty 1

## 2020-01-23 MED ORDER — KETAMINE HCL 10 MG/ML IJ SOLN
INTRAMUSCULAR | Status: AC
  Filled 2020-01-23: qty 1

## 2020-01-23 MED ORDER — TRAZODONE HCL 100 MG PO TABS
100.0000 mg | ORAL_TABLET | Freq: Every day | ORAL | Status: DC
  Filled 2020-01-23: qty 1

## 2020-01-23 MED ORDER — SUGAMMADEX SODIUM 200 MG/2ML IV SOLN
INTRAVENOUS | Status: DC | PRN
  Administered 2020-01-23: 200 mg via INTRAVENOUS

## 2020-01-23 MED ORDER — DEXTROSE-NACL 5-0.45 % IV SOLN
INTRAVENOUS | Status: DC
  Filled 2020-01-23: qty 1000

## 2020-01-23 MED ORDER — VASOPRESSIN 20 UNIT/ML IV SOLN
INTRAVENOUS | Status: DC | PRN
  Administered 2020-01-23: 08:00:00 12.6 mL via INTRAMUSCULAR

## 2020-01-23 MED ORDER — HYDROMORPHONE HCL 1 MG/ML IJ SOLN
0.2500 mg | INTRAMUSCULAR | Status: DC | PRN
  Administered 2020-01-23 (×2): 0.5 mg via INTRAVENOUS
  Filled 2020-01-23: qty 0.5

## 2020-01-23 MED ORDER — FENTANYL CITRATE (PF) 250 MCG/5ML IJ SOLN
INTRAMUSCULAR | Status: AC
  Filled 2020-01-23: qty 5

## 2020-01-23 MED ORDER — ONDANSETRON HCL 4 MG PO TABS
4.0000 mg | ORAL_TABLET | Freq: Four times a day (QID) | ORAL | Status: DC | PRN
  Filled 2020-01-23: qty 1

## 2020-01-23 MED ORDER — BUPIVACAINE HCL (PF) 0.5 % IJ SOLN
INTRAMUSCULAR | Status: DC | PRN
  Administered 2020-01-23: 7.4 mL

## 2020-01-23 MED ORDER — OXYCODONE HCL 5 MG PO TABS
5.0000 mg | ORAL_TABLET | Freq: Once | ORAL | Status: DC | PRN
  Filled 2020-01-23: qty 1

## 2020-01-23 MED ORDER — CEFAZOLIN SODIUM-DEXTROSE 2-4 GM/100ML-% IV SOLN
INTRAVENOUS | Status: AC
  Filled 2020-01-23: qty 100

## 2020-01-23 MED ORDER — METOCLOPRAMIDE HCL 5 MG/ML IJ SOLN
10.0000 mg | Freq: Once | INTRAMUSCULAR | Status: DC | PRN
  Filled 2020-01-23: qty 2

## 2020-01-23 MED ORDER — BISACODYL 10 MG RE SUPP
10.0000 mg | Freq: Every day | RECTAL | Status: DC | PRN
  Filled 2020-01-23: qty 1

## 2020-01-23 MED ORDER — ALUM & MAG HYDROXIDE-SIMETH 200-200-20 MG/5ML PO SUSP
30.0000 mL | ORAL | Status: DC | PRN
  Filled 2020-01-23: qty 30

## 2020-01-23 MED ORDER — PROPOFOL 10 MG/ML IV BOLUS
INTRAVENOUS | Status: AC
  Filled 2020-01-23: qty 40

## 2020-01-23 MED ORDER — OXYCODONE HCL 5 MG PO TABS
5.0000 mg | ORAL_TABLET | ORAL | Status: DC | PRN
  Administered 2020-01-23 (×2): 5 mg via ORAL
  Filled 2020-01-23: qty 2

## 2020-01-23 MED ORDER — ARTIFICIAL TEARS OPHTHALMIC OINT
TOPICAL_OINTMENT | OPHTHALMIC | Status: AC
  Filled 2020-01-23: qty 3.5

## 2020-01-23 MED ORDER — SCOPOLAMINE 1 MG/3DAYS TD PT72
MEDICATED_PATCH | TRANSDERMAL | Status: AC
  Filled 2020-01-23: qty 1

## 2020-01-23 MED ORDER — GABAPENTIN 300 MG PO CAPS
300.0000 mg | ORAL_CAPSULE | Freq: Three times a day (TID) | ORAL | Status: DC
  Administered 2020-01-23: 300 mg via ORAL
  Filled 2020-01-23: qty 1

## 2020-01-23 MED ORDER — IBUPROFEN 800 MG PO TABS: 800.0000 mg | ORAL_TABLET | Freq: Three times a day (TID) | ORAL | 0 refills | Status: DC | PRN

## 2020-01-23 MED ORDER — LIDOCAINE 2% (20 MG/ML) 5 ML SYRINGE
INTRAMUSCULAR | Status: DC | PRN
  Administered 2020-01-23: 60 mg via INTRAVENOUS

## 2020-01-23 MED ORDER — KETOROLAC TROMETHAMINE 30 MG/ML IJ SOLN
30.0000 mg | Freq: Four times a day (QID) | INTRAMUSCULAR | Status: DC
  Filled 2020-01-23: qty 1

## 2020-01-23 MED ORDER — OXYCODONE HCL 5 MG PO TABS: 5.0000 mg | ORAL_TABLET | ORAL | 0 refills | Status: DC | PRN

## 2020-01-23 MED ORDER — GABAPENTIN 300 MG PO CAPS: 300.0000 mg | ORAL_CAPSULE | Freq: Three times a day (TID) | ORAL | 0 refills | Status: DC

## 2020-01-23 MED ORDER — SCOPOLAMINE 1 MG/3DAYS TD PT72
1.0000 | MEDICATED_PATCH | TRANSDERMAL | Status: DC
  Administered 2020-01-23: 1.5 mg via TRANSDERMAL
  Filled 2020-01-23: qty 1

## 2020-01-23 SURGICAL SUPPLY — 25 items
CANISTER SUCT 3000ML PPV (MISCELLANEOUS) ×4 IMPLANT
CATH SILICONE 16FRX5CC (CATHETERS) ×8 IMPLANT
COVER WAND RF STERILE (DRAPES) ×4 IMPLANT
DECANTER SPIKE VIAL GLASS SM (MISCELLANEOUS) ×8 IMPLANT
GAUZE 4X4 16PLY RFD (DISPOSABLE) ×4 IMPLANT
GLOVE BIOGEL PI IND STRL 6.5 (GLOVE) ×2 IMPLANT
GLOVE BIOGEL PI IND STRL 8 (GLOVE) ×2 IMPLANT
GLOVE BIOGEL PI INDICATOR 6.5 (GLOVE) ×2
GLOVE BIOGEL PI INDICATOR 8 (GLOVE) ×2
GLOVE SURG SS PI 6.0 STRL IVOR (GLOVE) ×8 IMPLANT
GLOVE SURG SS PI 7.0 STRL IVOR (GLOVE) ×4 IMPLANT
GLOVE SURG SS PI 7.5 STRL IVOR (GLOVE) ×4 IMPLANT
GOWN STRL REUS W/ TWL LRG LVL3 (GOWN DISPOSABLE) ×6 IMPLANT
GOWN STRL REUS W/TWL LRG LVL3 (GOWN DISPOSABLE) ×6
GOWN STRL REUS W/TWL XL LVL3 (GOWN DISPOSABLE) ×4 IMPLANT
NS IRRIG 500ML POUR BTL (IV SOLUTION) ×4 IMPLANT
PACK VAGINAL WOMENS (CUSTOM PROCEDURE TRAY) ×4 IMPLANT
PAD OB MATERNITY 4.3X12.25 (PERSONAL CARE ITEMS) ×4 IMPLANT
SET IRRIG Y TYPE TUR BLADDER L (SET/KITS/TRAYS/PACK) ×4 IMPLANT
SHEARS FOC LG CVD HARMONIC 17C (MISCELLANEOUS) ×4 IMPLANT
SUT CHROMIC 1MO 4 18 CR8 (SUTURE) ×4 IMPLANT
SUT SILK 2 0 SH (SUTURE) ×4 IMPLANT
SUT VIC AB 2-0 CT1 27 (SUTURE) ×2
SUT VIC AB 2-0 CT1 TAPERPNT 27 (SUTURE) ×2 IMPLANT
TOWEL OR 17X26 10 PK STRL BLUE (TOWEL DISPOSABLE) ×4 IMPLANT

## 2020-01-23 NOTE — Transfer of Care (Signed)
Immediate Anesthesia Transfer of Care Note  Patient: Kristi Davies  Procedure(s) Performed: HYSTERECTOMY VAGINAL (N/A Vagina ) CYSTOSCOPY (N/A Urethra)  Patient Location: PACU  Anesthesia Type:General  Level of Consciousness: awake, alert , oriented and patient cooperative  Airway & Oxygen Therapy: Patient Spontanous Breathing and Patient connected to nasal cannula oxygen  Post-op Assessment: Report given to RN and Post -op Vital signs reviewed and stable  Post vital signs: Reviewed and stable  Last Vitals:  Vitals Value Taken Time  BP    Temp    Pulse 95 01/23/20 0901  Resp    SpO2 93 % 01/23/20 0901  Vitals shown include unvalidated device data.  Last Pain:  Vitals:   01/23/20 0717  TempSrc: Oral  PainSc: 4       Patients Stated Pain Goal: 4 (01/23/20 0717)  Complications: No apparent anesthesia complications

## 2020-01-23 NOTE — Interval H&P Note (Signed)
History and Physical Interval Note:  01/23/2020 7:43 AM  Kristi Davies  has presented today for surgery, with the diagnosis of pelvic pain, abnormal bleeding.  The various methods of treatment have been discussed with the patient and family. After consideration of risks, benefits and other options for treatment, the patient has consented to  Procedure(s): HYSTERECTOMY VAGINAL (N/A) CYSTOSCOPY (N/A) as a surgical intervention.  The patient's history has been reviewed, patient examined, no change in status, stable for surgery.  I have reviewed the patient's chart and labs.  Questions were answered to the patient's satisfaction.     Leighton Roach Kadyn Guild

## 2020-01-23 NOTE — Op Note (Signed)
Preoperative diagnosis: Abnormal uterine bleeding, pelvic pain Postop diagnosis: Same Procedure: Total vaginal hysterectomy and cystoscopy Surgeon: Lavina Hamman M.D. Assistant: Ellison Hughs, MD Anesthesia: Gen. Findings: Patient had a normal uterus with normal ovaries. Via cystoscopy bladder was normal and both ureters were patent.  Assistance from Dr. Reina Fuse was necessary throughout the case to help expose pedicles and control bleeding Estimated blood loss: 50 cc Specimens: Uterus for routine pathology Complications: None   Procedure in detail: The patient was taken to the operating room and placed in the dorsosupine position. General anesthesia was induced and she was placed in mobile stirrups. Legs were elevated in the stirrups. Perineum and vagina were then prepped and draped in the usual sterile fashion, bladder drained with red Robinson catheter. A Graves speculum was inserted in the vagina and the cervix was grasped with Christella Hartigan tenaculum. Dilute Pitressin with Marcaine was then instilled at the cervicovaginal junction which was then incised circumferentially with electrocautery. Sharp dissection was then used to further free the vagina from the cervix. Anterior peritoneum was identified and entered sharply. A Deaver retractor was used to retract the bladder anteriorly. Posterior cul-de-sac was identified and entered sharply. A Bonnano speculum was placed into the posterior cul-de-sac. Uterosacral ligaments were clamped transected and ligated with #1 chromic and tagged for later use. The remaining pedicles were then taken down with harmonic scalpel.  Ovaries were inspected and found to be normal. A small amount of bleeding was controlled with the Harmonic scalpel.  The uterosacral ligaments were then plicated in the midline with 2-0 silk after the Bonnano speculum was removed and a shorter vaginal speculum was placed. The previously tagged uterosacral pedicles were also tied in the midline. No  significant bleeding was identified. The vagina was then closed in a vertical fashion with running locking 2-0 Vicryl with adequate closure and adequate hemostasis.   Attention was turned to cystoscopy.  A 70 cystoscope was inserted and 200 cc of fluid was instilled. The bladder appeared normal. Both ureteral orifices were easily identified and urine was seen to flow freely from each orifice. The cystoscope was removed and the bladder was drained with a red robinson catheter. The patient was taken down from stirrups. She was awakened in the operating room and taken to the recovery room in stable condition after tolerating the procedure well. Counts were correct, she had PAS hose on throughout the procedure, she received Ancef preop.

## 2020-01-23 NOTE — Discharge Instructions (Signed)
Routine instructions for vaginal hysterectomy 

## 2020-01-23 NOTE — Progress Notes (Signed)
Post-op check, TVH Doing well, pain controlled, no n/v, has voided and ambulated Afeb, VSS Abd- soft Will discharge home after dinner

## 2020-01-23 NOTE — H&P (Signed)
Kristi Davies is an 44 y.o. female. Previous bilateral salpingectomy for contraception, also with endometrial ablation for menorrhagia. Now having frequent bleeding episodes of 1-2 days, some pelvic cramping. Pelvic ultrasound in December normal except probable adenomyosis.  She is ready for definitive surgical management.  Pertinent Gynecological History: Last mammogram: normal Date: 11/2019 Last pap: normal Date: 2018 OB History: G2, P1011   Menstrual History: No LMP recorded. Patient has had an ablation.    Past Medical History:  Diagnosis Date  . Allergy   . Asthma   . Bipolar disorder (HCC)    Type 2  . Complication of anesthesia    takes a while for Anesthesia to wear off  . Depression   . GERD (gastroesophageal reflux disease)   . Headache    Migraines  . IBS (irritable bowel syndrome)   . Insomnia   . Interstitial cystitis    goes to [facility] for treatment  . PCOS (polycystic ovarian syndrome)   . Pneumonia    history of  . Vitamin D deficiency     Past Surgical History:  Procedure Laterality Date  . BLADDER SUSPENSION N/A 08/30/2013   Procedure: TRANSVAGINAL TAPE (TVT) PROCEDURE;  Surgeon: Lavina Hamman, MD;  Location: [facility];  Service: Gynecology;  Laterality: N/A;  . EYE SURGERY    . HYSTEROSCOPY WITH NOVASURE N/A 08/30/2013   Procedure: HYSTEROSCOPY WITH NOVASURE;  Surgeon: Lavina Hamman, MD;  Location: [facility];  Service: Gynecology;  Laterality: N/A;  1 1/2 hrs OR time  . LAPAROSCOPIC TUBAL LIGATION Bilateral 11/20/2015   Procedure: LAPAROSCOPIC TUBAL LIGATION;  Surgeon: Lavina Hamman, MD;  Location: [facility];  Service: Gynecology;  Laterality: Bilateral;  . SALPINGECTOMY    . TRIGGER FINGER RELEASE  11/2019   by Dr. Janee Morn  trigger finger right ring finger right hand  . wisdom tooth extraction      Family History  Problem Relation Age of Onset  . Depression Mother   . Diabetes Mother   . Asthma Mother   . Migraines Mother   . Hypertension  Mother   . Diabetes Father   . Heart disease Father   . Hypertension Father   . Dementia Maternal Grandmother   . Breast cancer Maternal Grandmother   . Heart disease Maternal Grandfather   . Dementia Paternal Grandmother   . Stroke Paternal Grandmother   . Arthritis Paternal Grandfather   . Heart disease Paternal Grandfather     Social History:  reports that she has never smoked. She has never used smokeless tobacco. She reports current alcohol use of about 1.0 standard drinks of alcohol per week. She reports that she does not use drugs.  Allergies:  Allergies  Allergen Reactions  . Shrimp [Shellfish Allergy] Anaphylaxis  . Latex Itching  . Levaquin [Levofloxacin In D5w] Hives  . Levofloxacin Hives  . Sulfa Antibiotics Rash    Medications Prior to Admission  Medication Sig Dispense Refill Last Dose  . albuterol (PROVENTIL) (2.5 MG/3ML) 0.083% nebulizer solution 1 vial in neb every 6 hours as needed  1   . albuterol (VENTOLIN HFA) 108 (90 Base) MCG/ACT inhaler Inhale 1-2 puffs into the lungs every 6 (six) hours as needed for wheezing or shortness of breath. 18 g 2   . benzonatate (TESSALON) 200 MG capsule Take 1 capsule (200 mg total) by mouth 3 (three) times daily as needed for cough. 45 capsule 2   . budesonide-formoterol (SYMBICORT) 80-4.5 MCG/ACT inhaler .Take 2 puffs first thing in am and then  another 2 puffs about 12 hours later. 1 Inhaler 12   . clonazePAM (KLONOPIN) 0.5 MG tablet TAKE 1 TABLET BY MOUTH 2 TIMES DAILY AS NEEDED FOR ANXIETY  0   . cyclobenzaprine (FLEXERIL) 10 MG tablet Take 10 mg by mouth 3 (three) times daily as needed for muscle spasms.     Marland Kitchen guaiFENesin-codeine 100-10 MG/5ML syrup Take 5 mLs by mouth every 6 (six) hours as needed for cough. 240 mL 0   . lamoTRIgine (LAMICTAL) 150 MG tablet Take 150 mg by mouth daily.     Marland Kitchen levocetirizine (XYZAL) 5 MG tablet Take 5 mg by mouth every evening.     . modafinil (PROVIGIL) 200 MG tablet TAKE 1/2 TAB WHEN  WAKING. IF TOLERATED AFTER 7 DAYS AND FEEL YOU NEED ADDITIONAL HELP YOU CAN INCREASE TO 1 TAB DAILY (Patient taking differently: TAKE 1/2 TAB WHEN WAKING. IF TOLERATED AFTER 7 DAYS AND FEEL YOU NEED ADDITIONAL HELP YOU CAN INCREASE TO 1 TAB DAILY as needed) 30 tablet 5   . montelukast (SINGULAIR) 10 MG tablet Take 10 mg by mouth at bedtime.     . pantoprazole (PROTONIX) 40 MG tablet Take 30- 60 min before your first and last meals of the day (Patient taking differently: Take 30- 60 min before your first and last meals of the day as needed) 60 tablet 2   . traZODone (DESYREL) 100 MG tablet Take 100 mg by mouth at bedtime.     Marland Kitchen UNABLE TO FIND Med Name: CPAP     . zaleplon (SONATA) 10 MG capsule TAKE 1 CAPSULE BY MOUTH AT BEDTIME AS NEEDED FOR SLEEP 30 capsule 5     Review of Systems  Respiratory: Negative.   Cardiovascular: Negative.     There were no vitals taken for this visit. Physical Exam  Constitutional: She appears well-developed and well-nourished.  Neck: No thyromegaly present.  Cardiovascular: Normal rate, regular rhythm and normal heart sounds.  No murmur heard. Respiratory: Effort normal and breath sounds normal. No respiratory distress.  GI: Soft. She exhibits no distension and no mass. There is no abdominal tenderness.  Genitourinary:    Vagina and uterus normal.     Genitourinary Comments: No adnexal mass   Musculoskeletal:     Cervical back: Neck supple.    Results for orders placed or performed during the hospital encounter of 01/23/20 (from the past 24 hour(s))  Pregnancy, urine POC     Status: None   Collection Time: 01/23/20  6:33 AM  Result Value Ref Range   Preg Test, Ur NEGATIVE NEGATIVE    No results found.  Assessment/Plan: Persistent abnormal uterine bleeding, previous endometrial ablation, possible adenomyosis by u/s.  All medical and surgical options have been discussed. Surgical procedure and risks, chances of relieving symptoms have all been  discussed. Will admit for [facility], cystoscopy  Blane Ohara [place] Rico 01/23/2020, 6:41 AM

## 2020-01-23 NOTE — Anesthesia Procedure Notes (Signed)
Procedure Name: Intubation Date/Time: 01/23/2020 7:56 AM Performed by: Tyrone Nine, CRNA Pre-anesthesia Checklist: Patient being monitored, Suction available, Emergency Drugs available, Patient identified and Timeout performed Patient Re-evaluated:Patient Re-evaluated prior to induction Oxygen Delivery Method: Circle system utilized Preoxygenation: Pre-oxygenation with 100% oxygen Induction Type: IV induction Ventilation: Mask ventilation without difficulty Grade View: Grade II Tube type: Oral Tube size: 7.0 mm Number of attempts: 1 Airway Equipment and Method: Stylet and Bite block Placement Confirmation: breath sounds checked- equal and bilateral,  CO2 detector,  positive ETCO2 and ETT inserted through vocal cords under direct vision Secured at: 20 cm Dental Injury: Teeth and Oropharynx as per pre-operative assessment  Comments: Cricoid manipulation gave Grade II view

## 2020-01-23 NOTE — Anesthesia Postprocedure Evaluation (Signed)
Anesthesia Post Note  Patient: Designer, jewellery  Procedure(s) Performed: HYSTERECTOMY VAGINAL (N/A Vagina ) CYSTOSCOPY (N/A Urethra)     Patient location during evaluation: PACU Anesthesia Type: General Level of consciousness: awake and alert and oriented Pain management: pain level controlled Vital Signs Assessment: post-procedure vital signs reviewed and stable Respiratory status: spontaneous breathing, nonlabored ventilation and respiratory function stable Cardiovascular status: blood pressure returned to baseline and stable Postop Assessment: no apparent nausea or vomiting Anesthetic complications: no    Last Vitals:  Vitals:   01/23/20 1000 01/23/20 1007  BP:  120/68  Pulse: 93 91  Resp: 13 18  Temp:  36.9 C  SpO2: 99% 100%    Last Pain:  Vitals:   01/23/20 0945  TempSrc:   PainSc: 3                  Aliscia Clayton A.

## 2020-01-24 LAB — SURGICAL PATHOLOGY

## 2020-01-25 NOTE — Discharge Summary (Signed)
Physician Discharge Summary  Patient ID: Kristi Davies MRN: [id number] DOB/AGE: [birth year] 44 y.o.  Admit date: 01/23/2020 Discharge date: 01/23/2020  Admission Diagnoses:  Abnormal uterine bleeding, pelvic pain  Discharge Diagnoses: Same Active Problems:   Abnormal uterine bleeding (AUB)   S/P vaginal hysterectomy   Discharged Condition: good  Hospital Course: She underwent TVH and cystoscopy without complications.  By the evening of surgery she was ambulating, tolerating PO, voiding, adequate pain control, stable for discharge   Discharge Exam: Blood pressure 105/72, pulse 93, temperature 98.8 F (37.1 C), resp. rate 16, height 5\' 3"  (1.6 m), weight 91.3 kg, SpO2 97 %. General appearance: alert  Disposition: Discharge disposition: 01-Home or Self Care       Discharge Instructions    Call MD for:  difficulty breathing, headache or visual disturbances   Complete by: As directed    Call MD for:  persistant dizziness or light-headedness   Complete by: As directed    Call MD for:  persistant nausea and vomiting   Complete by: As directed    Call MD for:  severe uncontrolled pain   Complete by: As directed    Call MD for:  temperature >100.4   Complete by: As directed    Diet - low sodium heart healthy   Complete by: As directed    Increase activity slowly   Complete by: As directed    Lifting restrictions   Complete by: As directed    10 lbs   Sexual Activity Restrictions   Complete by: As directed    Pelvic rest     Allergies as of 01/23/2020      Reactions   Shrimp [shellfish Allergy] Anaphylaxis   Latex Itching   Levaquin [levofloxacin In D5w] Hives   Levofloxacin Hives   Sulfa Antibiotics Rash      Medication List    TAKE these medications   albuterol (2.5 MG/3ML) 0.083% nebulizer solution Commonly known as: PROVENTIL 1 vial in neb every 6 hours as needed   albuterol 108 (90 Base) MCG/ACT inhaler Commonly known as: VENTOLIN HFA Inhale 1-2 puffs  into the lungs every 6 (six) hours as needed for wheezing or shortness of breath.   benzonatate 200 MG capsule Commonly known as: TESSALON Take 1 capsule (200 mg total) by mouth 3 (three) times daily as needed for cough.   budesonide-formoterol 80-4.5 MCG/ACT inhaler Commonly known as: Symbicort .Take 2 puffs first thing in am and then another 2 puffs about 12 hours later.   clonazePAM 0.5 MG tablet Commonly known as: KLONOPIN TAKE 1 TABLET BY MOUTH 2 TIMES DAILY AS NEEDED FOR ANXIETY   cyclobenzaprine 10 MG tablet Commonly known as: FLEXERIL Take 10 mg by mouth 3 (three) times daily as needed for muscle spasms.   gabapentin 300 MG capsule Commonly known as: NEURONTIN Take 1 capsule (300 mg total) by mouth 3 (three) times daily.   guaiFENesin-codeine 100-10 MG/5ML syrup Take 5 mLs by mouth every 6 (six) hours as needed for cough.   ibuprofen 800 MG tablet Commonly known as: ADVIL Take 1 tablet (800 mg total) by mouth every 8 (eight) hours as needed.   lamoTRIgine 150 MG tablet Commonly known as: LAMICTAL Take 150 mg by mouth daily.   levocetirizine 5 MG tablet Commonly known as: XYZAL Take 5 mg by mouth every evening.   modafinil 200 MG tablet Commonly known as: PROVIGIL TAKE 1/2 TAB WHEN WAKING. IF TOLERATED AFTER 7 DAYS AND FEEL YOU NEED ADDITIONAL HELP YOU CAN  INCREASE TO 1 TAB DAILY What changed: additional instructions   montelukast 10 MG tablet Commonly known as: SINGULAIR Take 10 mg by mouth at bedtime.   oxyCODONE 5 MG immediate release tablet Commonly known as: Oxy IR/ROXICODONE Take 1 tablet (5 mg total) by mouth every 4 (four) hours as needed for severe pain.   pantoprazole 40 MG tablet Commonly known as: Protonix Take 30- 60 min before your first and last meals of the day What changed: additional instructions   traZODone 100 MG tablet Commonly known as: DESYREL Take 100 mg by mouth at bedtime.   UNABLE TO FIND Med Name: CPAP   zaleplon 10 MG  capsule Commonly known as: SONATA TAKE 1 CAPSULE BY MOUTH AT BEDTIME AS NEEDED FOR SLEEP      Follow-up Information    Jestina Stephani, MD. Schedule an appointment as soon as possible for a visit in 6 week(s).   Specialty: Obstetrics and Gynecology Contact information: [address] [place] [address] [phone]           Signed: Leighton Roach Kerri Kovacik 01/25/2020, 8:32 AM

## 2020-03-20 ENCOUNTER — Encounter: Payer: Self-pay | Admitting: General Practice

## 2020-07-08 ENCOUNTER — Ambulatory Visit: Payer: 59 | Admitting: Internal Medicine

## 2020-07-08 ENCOUNTER — Other Ambulatory Visit: Payer: Self-pay | Admitting: Neurology

## 2020-08-21 ENCOUNTER — Other Ambulatory Visit: Payer: 59

## 2020-08-21 ENCOUNTER — Other Ambulatory Visit: Payer: Self-pay

## 2020-08-21 DIAGNOSIS — Z20822 Contact with and (suspected) exposure to covid-19: Secondary | ICD-10-CM

## 2020-08-24 LAB — NOVEL CORONAVIRUS, NAA: SARS-CoV-2, NAA: NOT DETECTED

## 2020-09-12 ENCOUNTER — Telehealth: Payer: Self-pay | Admitting: Internal Medicine

## 2020-09-12 NOTE — Telephone Encounter (Signed)
Routing to Dr. Thurston Hole nurse for follow-up as I'm only here PRN.

## 2020-09-12 NOTE — Telephone Encounter (Signed)
ATC pt, no answer. Sent a [employer] as this is how pt initiated conversation.

## 2020-09-23 ENCOUNTER — Other Ambulatory Visit: Payer: Self-pay | Admitting: Neurology

## 2020-09-23 ENCOUNTER — Telehealth: Payer: Self-pay | Admitting: Neurology

## 2020-09-23 MED ORDER — ZALEPLON 10 MG PO CAPS: ORAL_CAPSULE | ORAL | 0 refills | Status: DC

## 2020-09-23 NOTE — Telephone Encounter (Signed)
Pt is requesting a refill for zaleplon (SONATA) 10 MG capsule.  Pharmacy: [facility] [phone] [facility] [employer]   Husband is asking for a call if this can not be filled

## 2020-09-23 NOTE — Telephone Encounter (Signed)
Called the patient's husband because it has been over a yr since pt has been seen. Was able to work her in Thursday for follow up with Tylene Fantasia, NP. Scheduled 11:30 am 10/21 with check in of 11 am. Pt's husband verbalized understanding. Advised a 1 mth supply of medication will be sent into to make sure that apt on Thursday is kept for future refills.

## 2020-09-25 ENCOUNTER — Encounter: Payer: Self-pay | Admitting: Neurology

## 2020-09-26 ENCOUNTER — Encounter: Payer: Self-pay | Admitting: Adult Health

## 2020-09-26 ENCOUNTER — Ambulatory Visit (INDEPENDENT_AMBULATORY_CARE_PROVIDER_SITE_OTHER): Payer: 59 | Admitting: Adult Health

## 2020-09-26 VITALS — BP 136/98 | HR 73 | Ht 63.0 in | Wt 208.0 lb

## 2020-09-26 DIAGNOSIS — G4733 Obstructive sleep apnea (adult) (pediatric): Secondary | ICD-10-CM

## 2020-09-26 DIAGNOSIS — Z9989 Dependence on other enabling machines and devices: Secondary | ICD-10-CM

## 2020-09-26 DIAGNOSIS — G4700 Insomnia, unspecified: Secondary | ICD-10-CM | POA: Diagnosis not present

## 2020-09-26 DIAGNOSIS — R400 Somnolence: Secondary | ICD-10-CM | POA: Diagnosis not present

## 2020-09-26 NOTE — Patient Instructions (Addendum)
.  Continue using CPAP nightly and greater than 4 hours each night Continue Provigil and sonata If your symptoms worsen or you develop new symptoms please let us know.

## 2020-09-26 NOTE — Progress Notes (Signed)
PATIENT: Kristi Davies DOB: October 18, 1976  REASON FOR VISIT: follow up HISTORY FROM: patient  HISTORY OF PRESENT ILLNESS: Today 09/26/20:  Ms. Tech is a 44 year old female with a history if OSA on CPAP. She returns today for follow-up. Her CPAP download indicates that she used her machine 30/30 days for compliance of 100%. She used her machine >4 hours for compliance of 86.7%.  Residual AHI is 1.4 on 5 to 7.5 cm of water.  She reports that the CPAP is working well for her.  She feels that her fatigue score is elevated due to increased stress.  She continues to take Sonata at bedtime with good benefit.  She takes 100 mg of Provigil during the day.  States that the 200 mg made her stay awake for 16 hours.  She does not take it daily but typically when she has class.  HISTORY 03-23-2019, VIDEO-  Kristi Davies is a 44 y.o. female patient , she is currently in quarantine due to CORVID 19 exposure and symptoms. She reports that the CPAP she has just been issued and moisture and pressure have helped symptoms of chest tightness. She has still a feeling of not being able to breathe deeply. The symptoms peaked about a week ago. Her husband was also sick in early March, his leg had a staph infection. He is an EMT.  She has felt good on CPAP, but has trouble to get 4 hours of use. 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  Sleep time is between 7.30 AM and 4 PM,  Currently not in class / virtual.  She is using codeine cough syrup. I encouraged her to use it regularly.   Download AHI 3.4/h, 27/30 days of use, average 4. 16 minutes, compliance 56.7% with better quality of sleep.  Fatigue may be related to Covid 19  .     REVIEW OF SYSTEMS: Out of a complete 14 system review of symptoms, the patient complains only of the following symptoms, and all other reviewed systems are negative.  FSS 39 ESS 6  ALLERGIES: Allergies  Allergen Reactions  . Shrimp [Shellfish Allergy] Anaphylaxis    . Latex Itching  . Levaquin [Levofloxacin In D5w] Hives  . Levofloxacin Hives  . Sulfa Antibiotics Rash    HOME MEDICATIONS: Outpatient Medications Prior to Visit  Medication Sig Dispense Refill  . albuterol (PROVENTIL) (2.5 MG/3ML) 0.083% nebulizer solution 1 vial in neb every 6 hours as needed  1  . albuterol (VENTOLIN HFA) 108 (90 Base) MCG/ACT inhaler Inhale 1-2 puffs into the lungs every 6 (six) hours as needed for wheezing or shortness of breath. 18 g 2  . benzonatate (TESSALON) 200 MG capsule Take 1 capsule (200 mg total) by mouth 3 (three) times daily as needed for cough. 45 capsule 2  . budesonide-formoterol (SYMBICORT) 80-4.5 MCG/ACT inhaler .Take 2 puffs first thing in am and then another 2 puffs about 12 hours later. 1 Inhaler 12  . clonazePAM (KLONOPIN) 0.5 MG tablet TAKE 1 TABLET BY MOUTH 2 TIMES DAILY AS NEEDED FOR ANXIETY  0  . cyclobenzaprine (FLEXERIL) 10 MG tablet Take 10 mg by mouth 3 (three) times daily as needed for muscle spasms.    Marland Kitchen gabapentin (NEURONTIN) 300 MG capsule Take 1 capsule (300 mg total) by mouth 3 (three) times daily. 6 capsule 0  . guaiFENesin-codeine 100-10 MG/5ML syrup Take 5 mLs by mouth every 6 (six) hours as needed for cough. 240 mL 0  .  ibuprofen (ADVIL) 800 MG tablet Take 1 tablet (800 mg total) by mouth every 8 (eight) hours as needed. 30 tablet 0  . lamoTRIgine (LAMICTAL) 150 MG tablet Take 150 mg by mouth daily.    Marland Kitchen levocetirizine (XYZAL) 5 MG tablet Take 5 mg by mouth every evening.    . modafinil (PROVIGIL) 200 MG tablet TAKE 1/2 TAB WHEN WAKING. IF TOLERATED AFTER 7 DAYS AND FEEL YOU NEED ADDITIONAL HELP YOU CAN INCREASE TO 1 TAB DAILY (Patient taking differently: TAKE 1/2 TAB WHEN WAKING. IF TOLERATED AFTER 7 DAYS AND FEEL YOU NEED ADDITIONAL HELP YOU CAN INCREASE TO 1 TAB DAILY as needed) 30 tablet 5  . montelukast (SINGULAIR) 10 MG tablet Take 10 mg by mouth at bedtime.    Marland Kitchen oxyCODONE (OXY IR/ROXICODONE) 5 MG immediate release tablet  Take 1 tablet (5 mg total) by mouth every 4 (four) hours as needed for severe pain. 15 tablet 0  . pantoprazole (PROTONIX) 40 MG tablet Take 30- 60 min before your first and last meals of the day (Patient taking differently: Take 30- 60 min before your first and last meals of the day as needed) 60 tablet 2  . traZODone (DESYREL) 100 MG tablet Take 100 mg by mouth at bedtime.    Marland Kitchen UNABLE TO FIND Med Name: CPAP    . zaleplon (SONATA) 10 MG capsule TAKE 1 CAPSULE BY MOUTH AT BEDTIME AS NEEDED FOR SLEEP 30 capsule 0   No facility-administered medications prior to visit.    PAST MEDICAL HISTORY: Past Medical History:  Diagnosis Date  . Allergy   . Asthma   . Bipolar disorder (HCC)    Type 2  . Complication of anesthesia    takes a while for Anesthesia to wear off  . Depression   . GERD (gastroesophageal reflux disease)   . Headache    Migraines  . IBS (irritable bowel syndrome)   . Insomnia   . Interstitial cystitis    goes to [facility] for treatment  . PCOS (polycystic ovarian syndrome)   . Pneumonia    history of  . Vitamin D deficiency     PAST SURGICAL HISTORY: Past Surgical History:  Procedure Laterality Date  . BLADDER SUSPENSION N/A 08/30/2013   Procedure: TRANSVAGINAL TAPE (TVT) PROCEDURE;  Surgeon: Lavina Hamman, MD;  Location: [facility];  Service: Gynecology;  Laterality: N/A;  . CYSTOSCOPY N/A 01/23/2020   Procedure: CYSTOSCOPY;  Surgeon: Lavina Hamman, MD;  Location: [facility];  Service: Gynecology;  Laterality: N/A;  . EYE SURGERY    . HYSTEROSCOPY WITH NOVASURE N/A 08/30/2013   Procedure: HYSTEROSCOPY WITH NOVASURE;  Surgeon: Lavina Hamman, MD;  Location: [facility];  Service: Gynecology;  Laterality: N/A;  1 1/2 hrs OR time  . LAPAROSCOPIC TUBAL LIGATION Bilateral 11/20/2015   Procedure: LAPAROSCOPIC TUBAL LIGATION;  Surgeon: Lavina Hamman, MD;  Location: [facility];  Service: Gynecology;  Laterality: Bilateral;  . SALPINGECTOMY    . TRIGGER  FINGER RELEASE  11/2019   by Dr. Janee Morn  trigger finger right ring finger right hand  . VAGINAL HYSTERECTOMY N/A 01/23/2020   Procedure: HYSTERECTOMY VAGINAL;  Surgeon: Lavina Hamman, MD;  Location: [facility];  Service: Gynecology;  Laterality: N/A;  . wisdom tooth extraction      FAMILY HISTORY: Family History  Problem Relation Age of Onset  . Depression Mother   . Diabetes Mother   . Asthma Mother   . Migraines Mother   . Hypertension Mother   .  Diabetes Father   . Heart disease Father   . Hypertension Father   . Dementia Maternal Grandmother   . Breast cancer Maternal Grandmother   . Heart disease Maternal Grandfather   . Dementia Paternal Grandmother   . Stroke Paternal Grandmother   . Arthritis Paternal Grandfather   . Heart disease Paternal Grandfather     SOCIAL HISTORY: Social History   Socioeconomic History  . Marital status: Married    Spouse name: Not on file  . Number of children: Not on file  . Years of education: Not on file  . Highest education level: Not on file  Occupational History  . Not on file  Tobacco Use  . Smoking status: Never Smoker  . Smokeless tobacco: Never Used  Vaping Use  . Vaping Use: Never used  Substance and Sexual Activity  . Alcohol use: Yes    Alcohol/week: 1.0 standard drink    Types: 1 Cans of beer per week  . Drug use: No  . Sexual activity: Yes    Birth control/protection: Other-see comments    Comment: vasectomy  Other Topics Concern  . Not on file  Social History Narrative   Single   Self-employed   1 daughter   Exercise: yes   Caffeine use: yes   Social Determinants of Health   Financial Resource Strain:   . Difficulty of Paying Living Expenses: Not on file  Food Insecurity:   . Worried About Programme researcher, broadcasting/film/videounning Out of Food in the Last Year: Not on file  . Ran Out of Food in the Last Year: Not on file  Transportation Needs:   . Lack of Transportation (Medical): Not on file  . Lack of  Transportation (Non-Medical): Not on file  Physical Activity:   . Days of Exercise per Week: Not on file  . Minutes of Exercise per Session: Not on file  Stress:   . Feeling of Stress : Not on file  Social Connections:   . Frequency of Communication with Friends and Family: Not on file  . Frequency of Social Gatherings with Friends and Family: Not on file  . Attends Religious Services: Not on file  . Active Member of Clubs or Organizations: Not on file  . Attends BankerClub or Organization Meetings: Not on file  . Marital Status: Not on file  Intimate Partner Violence:   . Fear of Current or Ex-Partner: Not on file  . Emotionally Abused: Not on file  . Physically Abused: Not on file  . Sexually Abused: Not on file      PHYSICAL EXAM  Vitals:   09/26/20 1113 09/26/20 1117  BP: (!) 145/96 (!) 136/98  Pulse: 76 73  Weight: 208 lb (94.3 kg)   Height: 5\' 3"  (1.6 m)    Body mass index is 36.85 kg/m.  Generalized: Well developed, in no acute distress  Chest: Lungs clear to auscultation bilaterally  Neurological examination  Mentation: Alert oriented to time, place, history taking. Follows all commands speech and language fluent Cranial nerve II-XII: Extraocular movements were full, visual field were full on confrontational test Head turning and shoulder shrug  were normal and symmetric. Motor: The motor testing reveals 5 over 5 strength of all 4 extremities. Good symmetric motor tone is noted throughout.  Sensory: Sensory testing is intact to soft touch on all 4 extremities. No evidence of extinction is noted.  Gait and station: Gait is normal.    DIAGNOSTIC DATA (LABS, IMAGING, TESTING) - I reviewed patient records, labs, notes,  testing and imaging myself where available.  Lab Results  Component Value Date   WBC 9.8 01/17/2020   HGB 13.4 01/17/2020   HCT 41.3 01/17/2020   MCV 91.4 01/17/2020   PLT 258 01/17/2020      Component Value Date/Time   NA 138 11/28/2016 1958    K 4.3 11/28/2016 1958   CL 101 11/28/2016 1958   CO2 25 11/28/2016 1958   GLUCOSE 108 (H) 11/28/2016 1958   BUN 19 11/28/2016 1958   CREATININE 0.86 11/28/2016 1958   CREATININE 0.85 11/12/2016 1333   CALCIUM 9.9 11/28/2016 1958   PROT 7.0 08/30/2013 0615   ALBUMIN 3.2 (L) 08/30/2013 0615   AST 15 08/30/2013 0615   ALT 11 08/30/2013 0615   ALKPHOS 58 08/30/2013 0615   BILITOT 0.3 08/30/2013 0615   GFRNONAA >60 11/28/2016 1958   GFRNONAA 87 11/12/2016 1333   GFRAA >60 11/28/2016 1958   GFRAA >89 11/12/2016 1333    Lab Results  Component Value Date   HGBA1C 5.7 05/13/2017   Lab Results  Component Value Date   VITAMINB12 364 11/12/2016   Lab Results  Component Value Date   TSH 1.02 11/12/2016      ASSESSMENT AND PLAN 44 y.o. year old female  has a past medical history of Allergy, Asthma, Bipolar disorder (HCC), Complication of anesthesia, Depression, GERD (gastroesophageal reflux disease), Headache, IBS (irritable bowel syndrome), Insomnia, Interstitial cystitis, PCOS (polycystic ovarian syndrome), Pneumonia, and Vitamin D deficiency. here with:  1. OSA on CPAP  - CPAP compliance excellent - Good treatment of AHI  - Encourage patient to use CPAP nightly and > 4 hours each night  2.  Insomnia  -Continue Sonata  3.  Daytime sleepiness  -Continue Provigil advised that we could decrease her to 50 mg tablet if 100 mg was keeping her awake at night.   - F/U in 1 year or sooner if needed   I spent 25 minutes of face-to-face and non-face-to-face time with patient.  This included previsit chart review, lab review, study review, order entry, electronic health record documentation, patient education.  Butch Penny, MSN, NP-C 09/26/2020, 11:08 AM [facility] [address] [place], [place] [address] [phone]

## 2020-10-21 ENCOUNTER — Other Ambulatory Visit: Payer: Self-pay | Admitting: Neurology

## 2020-10-22 ENCOUNTER — Other Ambulatory Visit: Payer: Self-pay | Admitting: Neurology

## 2020-10-30 ENCOUNTER — Other Ambulatory Visit: Payer: Self-pay | Admitting: Internal Medicine

## 2020-10-30 DIAGNOSIS — J45991 Cough variant asthma: Secondary | ICD-10-CM

## 2020-10-30 MED ORDER — BUDESONIDE-FORMOTEROL FUMARATE 80-4.5 MCG/ACT IN AERO: INHALATION_SPRAY | RESPIRATORY_TRACT | 11 refills | Status: AC

## 2020-10-30 NOTE — Telephone Encounter (Signed)
Dr. Sherene Sires please advise on the following My Chart message:  The [employer] discount paperwork that you provided for me was not approved for my income level. Is it possible to just send the prescription into my regular pharmacy? Thanks!   Thank you

## 2020-12-24 ENCOUNTER — Other Ambulatory Visit: Payer: Self-pay | Admitting: Neurology

## 2020-12-24 ENCOUNTER — Ambulatory Visit: Payer: 59 | Admitting: Family Medicine

## 2021-06-09 ENCOUNTER — Encounter: Payer: Self-pay | Admitting: Adult Health

## 2021-06-10 NOTE — Telephone Encounter (Signed)
Per [place] registry, last filled on 12/02/2019 Modafinil 200 Mg Tablet #30. Refill request sent to MM NP.

## 2021-06-11 MED ORDER — MODAFINIL 200 MG PO TABS: 100.0000 mg | ORAL_TABLET | Freq: Every day | ORAL | 2 refills | Status: DC | PRN

## 2021-07-06 ENCOUNTER — Other Ambulatory Visit: Payer: Self-pay | Admitting: Neurology

## 2021-07-07 ENCOUNTER — Other Ambulatory Visit: Payer: Self-pay

## 2021-09-10 ENCOUNTER — Other Ambulatory Visit: Payer: Self-pay | Admitting: Adult Health

## 2021-09-30 ENCOUNTER — Telehealth: Payer: Self-pay | Admitting: *Deleted

## 2021-09-30 ENCOUNTER — Telehealth: Payer: Self-pay | Admitting: Adult Health

## 2021-09-30 NOTE — Telephone Encounter (Signed)
Patient has a Mychart visit today .  Per  Butch Penny, NP  Pt needs a in office visit because she needs a Download from her CPAP 1 year follow up. Called and LVM for patient to call me back to reschedule.

## 2021-11-14 ENCOUNTER — Other Ambulatory Visit: Payer: Self-pay | Admitting: Adult Health

## 2021-12-08 NOTE — Progress Notes (Signed)
PATIENT: Kristi Davies DOB: [birth year]  REASON FOR VISIT: follow up HISTORY FROM: patient  HISTORY OF PRESENT ILLNESS: Today 12/08/21: Kristi Davies is a 46 year old female with a history of obstructive sleep apnea on CPAP, insomnia and daytime sleepiness.  She returns today for follow-up.  She reports that she is having nasal congestion.  She does report the nasal pillows.  She has seen ENT but reports that they did not offer any suggestions.  She tried Flonase but did not find it beneficial.  She continues on Sonata daily at bedtime continues on Provigil 100 mg daily.  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  She now takes her medicine at 7:30 AM and it does not wear off until around 8:30 PM.  She returns today for an evaluation.     10/21/21Ms. Davies is a 46 year old female with a history if OSA on CPAP. She returns today for follow-up. Her CPAP download indicates that she used her machine 30/30 days for compliance of 100%. She used her machine >4 hours for compliance of 86.7%.  Residual AHI is 1.4 on 5 to 7.5 cm of water.  She reports that the CPAP is working well for her.  She feels that her fatigue score is elevated due to increased stress.  She continues to take Sonata at bedtime with good benefit.  She takes 100 mg of Provigil during the day.  States that the 200 mg made her stay awake for 16 hours.  She does not take it daily but typically when she has class.  HISTORY 03-23-2019, VIDEO-  Kristi Davies is a 46 y.o. female patient , she is currently in quarantine due to CORVID 19 exposure and symptoms. She reports that the CPAP she has just been issued and moisture and pressure have helped symptoms of chest tightness. She has still a feeling of not being able to breathe deeply. The symptoms peaked about a week ago. Her husband was also sick in early March, his leg had a staph infection. He is an EMT.  She has felt good on CPAP, but has trouble to get 4 hours of use. 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  Sleep time is between 7.30 AM and 4 PM,  Currently not in class / virtual.  She is using codeine cough syrup. I encouraged her to use it regularly.    Download AHI 3.4/h, 27/30 days of use, average 4. 16 minutes, compliance 56.7% with better quality of sleep.  Fatigue may be related to Covid 19  .       REVIEW OF SYSTEMS: Out of a complete 14 system review of symptoms, the patient complains only of the following symptoms, and all other reviewed systems are negative.  ALLERGIES: Allergies  Allergen Reactions   Shrimp [Shellfish Allergy] Anaphylaxis   Latex Itching   Levaquin [Levofloxacin In D5w] Hives   Levofloxacin Hives   Sulfa Antibiotics Rash    HOME MEDICATIONS: Outpatient Medications Prior to Visit  Medication Sig Dispense Refill   albuterol (PROVENTIL) (2.5 MG/3ML) 0.083% nebulizer solution 1 vial in neb every 6 hours as needed  1   albuterol (VENTOLIN HFA) 108 (90 Base) MCG/ACT inhaler Inhale 1-2 puffs into the lungs every 6 (six) hours as needed for wheezing or shortness of breath. 18 g 2   benzonatate (TESSALON) 200 MG capsule Take 1 capsule (200 mg total) by mouth 3 (three) times daily as needed for cough. 45 capsule 2  budesonide-formoterol (SYMBICORT) 80-4.5 MCG/ACT inhaler .Take 2 puffs first thing in am and then another 2 puffs about 12 hours later. 10.2 g 11   clonazePAM (KLONOPIN) 0.5 MG tablet TAKE 1 TABLET BY MOUTH 2 TIMES DAILY AS NEEDED FOR ANXIETY  0   cyclobenzaprine (FLEXERIL) 10 MG tablet Take 10 mg by mouth 3 (three) times daily as needed for muscle spasms.     lamoTRIgine (LAMICTAL) 150 MG tablet Take 150 mg by mouth daily.     levocetirizine (XYZAL) 5 MG tablet Take 5 mg by mouth every evening.     modafinil (PROVIGIL) 200 MG tablet TAKE 0.5-1 TABLETS (100-200 MG TOTAL) BY MOUTH DAILY AS NEEDED. 30 tablet 0   montelukast (SINGULAIR) 10 MG tablet Take 10 mg by mouth at bedtime.     traZODone (DESYREL) 100 MG  tablet Take 100 mg by mouth at bedtime.     UNABLE TO FIND Med Name: CPAP     zaleplon (SONATA) 10 MG capsule TAKE 1 CAPSULE BY MOUTH EVERY DAY AT BEDTIME AS NEEDED 30 capsule 1   No facility-administered medications prior to visit.    PAST MEDICAL HISTORY: Past Medical History:  Diagnosis Date   Allergy    Asthma    Bipolar disorder (HCC)    Type 2   Complication of anesthesia    takes a while for Anesthesia to wear off   Depression    GERD (gastroesophageal reflux disease)    Headache    Migraines   IBS (irritable bowel syndrome)    Insomnia    Interstitial cystitis    goes to [facility] for treatment   PCOS (polycystic ovarian syndrome)    Pneumonia    history of   Vitamin D deficiency     PAST SURGICAL HISTORY: Past Surgical History:  Procedure Laterality Date   BLADDER SUSPENSION N/A 08/30/2013   Procedure: TRANSVAGINAL TAPE (TVT) PROCEDURE;  Surgeon: Lavina Hamman, MD;  Location: [facility];  Service: Gynecology;  Laterality: N/A;   CYSTOSCOPY N/A 01/23/2020   Procedure: CYSTOSCOPY;  Surgeon: Lavina Hamman, MD;  Location: [facility];  Service: Gynecology;  Laterality: N/A;   EYE SURGERY     HYSTEROSCOPY WITH NOVASURE N/A 08/30/2013   Procedure: HYSTEROSCOPY WITH NOVASURE;  Surgeon: Lavina Hamman, MD;  Location: [facility];  Service: Gynecology;  Laterality: N/A;  1 1/2 hrs OR time   LAPAROSCOPIC TUBAL LIGATION Bilateral 11/20/2015   Procedure: LAPAROSCOPIC TUBAL LIGATION;  Surgeon: Lavina Hamman, MD;  Location: [facility];  Service: Gynecology;  Laterality: Bilateral;   SALPINGECTOMY     TRIGGER FINGER RELEASE  11/2019   by Dr. Janee Morn  trigger finger right ring finger right hand   VAGINAL HYSTERECTOMY N/A 01/23/2020   Procedure: HYSTERECTOMY VAGINAL;  Surgeon: Lavina Hamman, MD;  Location[facility];  Service: Gynecology;  Laterality: N/A;   wisdom tooth extraction      FAMILY HISTORY: Family History  Problem Relation Age of  Onset   Depression Mother    Diabetes Mother    Asthma Mother    Migraines Mother    Hypertension Mother    Diabetes Father    Heart disease Father    Hypertension Father    Dementia Maternal Grandmother    Breast cancer Maternal Grandmother    Heart disease Maternal Grandfather    Dementia Paternal Grandmother    Stroke Paternal Grandmother    Arthritis Paternal Grandfather    Heart disease Paternal Grandfather     SOCIAL  HISTORY: Social History   Socioeconomic History   Marital status: Married    Spouse name: Not on file   Number of children: Not on file   Years of education: Not on file   Highest education level: Not on file  Occupational History   Not on file  Tobacco Use   Smoking status: Never   Smokeless tobacco: Never  Vaping Use   Vaping Use: Never used  Substance and Sexual Activity   Alcohol use: Yes    Alcohol/week: 1.0 standard drink    Types: 1 Cans of beer per week   Drug use: No   Sexual activity: Yes    Birth control/protection: Other-see comments    Comment: vasectomy  Other Topics Concern   Not on file  Social History Narrative   Single   Self-employed   1 daughter   Exercise: yes   Caffeine use: yes   Social Determinants of Health   Financial Resource Strain: Not on file  Food Insecurity: Not on file  Transportation Needs: Not on file  Physical Activity: Not on file  Stress: Not on file  Social Connections: Not on file  Intimate Partner Violence: Not on file      PHYSICAL EXAM  Vitals:   12/09/21 0825  BP: 120/79  Pulse: 71  Weight: 205 lb (93 kg)  Height: 5\' 4"  (1.626 m)    Body mass index is 35.19 kg/m.  Generalized: Well developed, in no acute distress  Chest: Lungs clear to auscultation bilaterally  Neurological examination  Mentation: Alert oriented to time, place, history taking. Follows all commands speech and language fluent Cranial nerve II-XII: Extraocular movements were limited with horizontal gaze to the  right and left due to previous strabismus surgery. Head turning and shoulder shrug  were normal and symmetric. Motor: The motor testing reveals 5 over 5 strength of all 4 extremities. Good symmetric motor tone is noted throughout.  Sensory: Sensory testing is intact to soft touch on all 4 extremities. No evidence of extinction is noted.  Gait and station: Gait is normal.    DIAGNOSTIC DATA (LABS, IMAGING, TESTING) - I reviewed patient records, labs, notes, testing and imaging myself where available.  Lab Results  Component Value Date   WBC 9.8 01/17/2020   HGB 13.4 01/17/2020   HCT 41.3 01/17/2020   MCV 91.4 01/17/2020   PLT 258 01/17/2020      Component Value Date/Time   NA 138 11/28/2016 1958   K 4.3 11/28/2016 1958   CL 101 11/28/2016 1958   CO2 25 11/28/2016 1958   GLUCOSE 108 (H) 11/28/2016 1958   BUN 19 11/28/2016 1958   CREATININE 0.86 11/28/2016 1958   CREATININE 0.85 11/12/2016 1333   CALCIUM 9.9 11/28/2016 1958   PROT 7.0 08/30/2013 0615   ALBUMIN 3.2 (L) 08/30/2013 0615   AST 15 08/30/2013 0615   ALT 11 08/30/2013 0615   ALKPHOS 58 08/30/2013 0615   BILITOT 0.3 08/30/2013 0615   GFRNONAA >60 11/28/2016 1958   GFRNONAA 87 11/12/2016 1333   GFRAA >60 11/28/2016 1958   GFRAA >89 11/12/2016 1333    Lab Results  Component Value Date   HGBA1C 5.7 05/13/2017   Lab Results  Component Value Date   VITAMINB12 364 11/12/2016   Lab Results  Component Value Date   TSH 1.02 11/12/2016      ASSESSMENT AND PLAN 46 y.o. year old female  has a past medical history of Allergy, Asthma, Bipolar disorder (HCC), Complication of  anesthesia, Depression, GERD (gastroesophageal reflux disease), Headache, IBS (irritable bowel syndrome), Insomnia, Interstitial cystitis, PCOS (polycystic ovarian syndrome), Pneumonia, and Vitamin D deficiency. here with:  OSA on CPAP  - CPAP compliance excellent - Good treatment of AHI  - Mask refitting d/t nasal congestion  - Encourage  patient to use CPAP nightly and > 4 hours each night  2.  Insomnia  -Continue Sonata 10 mg at bedtime PRN  3.  Daytime sleepiness  -Continue Provigil 100 mg daily   - F/U in 1 year or sooner if needed    Butch PennyMegan Mollyann Halbert, MSN, NP-C 12/08/2021, 1:14 PM [facility] [address], [address] [place], [place] [id number] [phone]

## 2021-12-09 ENCOUNTER — Ambulatory Visit (INDEPENDENT_AMBULATORY_CARE_PROVIDER_SITE_OTHER): Payer: PRIVATE HEALTH INSURANCE | Admitting: Adult Health

## 2021-12-09 ENCOUNTER — Other Ambulatory Visit: Payer: Self-pay

## 2021-12-09 ENCOUNTER — Telehealth: Payer: Self-pay

## 2021-12-09 ENCOUNTER — Encounter: Payer: Self-pay | Admitting: Adult Health

## 2021-12-09 VITALS — BP 120/79 | HR 71 | Ht 64.0 in | Wt 205.0 lb

## 2021-12-09 DIAGNOSIS — G4733 Obstructive sleep apnea (adult) (pediatric): Secondary | ICD-10-CM | POA: Diagnosis not present

## 2021-12-09 DIAGNOSIS — R400 Somnolence: Secondary | ICD-10-CM

## 2021-12-09 DIAGNOSIS — Z9989 Dependence on other enabling machines and devices: Secondary | ICD-10-CM

## 2021-12-09 DIAGNOSIS — G4700 Insomnia, unspecified: Secondary | ICD-10-CM

## 2021-12-09 MED ORDER — MODAFINIL 100 MG PO TABS: 100.0000 mg | ORAL_TABLET | Freq: Every day | ORAL | 5 refills | Status: DC | PRN

## 2021-12-09 NOTE — Patient Instructions (Signed)
Continue using CPAP nightly and greater than 4 hours each night Continue Sonata 10 mg at bedtime PRN Continue Provigil 100 mg daily If your symptoms worsen or you develop new symptoms please let us know.

## 2021-12-09 NOTE — Telephone Encounter (Signed)
I have submitted a PA request on CMM, Key: [id number] - PA Case ID: [id number]. Awaiting determination from [facility].

## 2021-12-09 NOTE — Addendum Note (Signed)
Addended by: Enedina Finner on: 12/09/2021 09:13 AM   Modules accepted: Orders

## 2021-12-15 NOTE — Telephone Encounter (Signed)
Pt's husband called states his wife received a denial letter for the modafinil (PROVIGIL) 100 MG tablet. Wanting to appeal the letter asking what is the next step.

## 2021-12-16 NOTE — Telephone Encounter (Signed)
I called and spoke to [facility] with [employer] [facility]. [phone], she said could re attempt on CMM or do appeal.  I attempt to do another CMM KEY [id number]. Determination pending.  Has only tried modafinil since 2020 for daytime sleepiness.  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  OSA with CPAP.

## 2021-12-22 NOTE — Telephone Encounter (Signed)
I received denial for Modafinil (is covered if you have a sleep disorder  defined by one of the following.  A) 15 or more breathing issues per hour of sleep confirmed by sleep study, 10-31-2018. B) five or more breathing issues per hour of sleep confirmed by a sleep study.  I faxed to them pt initial sleep study [phone] PA [id number] as additional information.

## 2021-12-23 NOTE — Telephone Encounter (Signed)
Fax confirmation received [facility] in [place] [phone].

## 2021-12-23 NOTE — Telephone Encounter (Signed)
Received approval thru [facility] REF # [id number] status request was overturned.  Modafinil 100mg  tabs was approved for 6 months 06-22-2022 .  [phone].  PT ID [id number].

## 2022-01-05 ENCOUNTER — Ambulatory Visit: Payer: Self-pay | Admitting: Adult Health

## 2022-04-07 ENCOUNTER — Encounter: Payer: Self-pay | Admitting: Adult Health

## 2022-04-07 MED ORDER — ZALEPLON 10 MG PO CAPS: ORAL_CAPSULE | ORAL | 1 refills | Status: DC

## 2022-04-07 NOTE — Telephone Encounter (Signed)
Last seen 12-2021.  Has yearly f/u in a year.  ?

## 2022-05-26 ENCOUNTER — Telehealth: Payer: Self-pay | Admitting: *Deleted

## 2022-05-26 ENCOUNTER — Encounter: Payer: Self-pay | Admitting: *Deleted

## 2022-05-26 NOTE — Telephone Encounter (Signed)
Modafinil PA, Key: [id number] , faxed notes to be attached.

## 2022-05-26 NOTE — Telephone Encounter (Signed)
Notes attached. Your information has been sent to [facility]. 

## 2022-05-27 NOTE — Telephone Encounter (Signed)
Modafinil approved 05/26/2022 - 11/25/2022, advised via my chart.

## 2022-07-15 ENCOUNTER — Other Ambulatory Visit: Payer: Self-pay | Admitting: Adult Health

## 2022-07-15 DIAGNOSIS — R400 Somnolence: Secondary | ICD-10-CM

## 2022-08-14 ENCOUNTER — Encounter: Payer: Self-pay | Admitting: Adult Health

## 2022-08-14 DIAGNOSIS — R400 Somnolence: Secondary | ICD-10-CM

## 2022-08-17 MED ORDER — MODAFINIL 200 MG PO TABS: 200.0000 mg | ORAL_TABLET | Freq: Every day | ORAL | 5 refills | Status: DC | PRN

## 2022-08-28 ENCOUNTER — Other Ambulatory Visit: Payer: Self-pay | Admitting: Adult Health

## 2022-09-01 ENCOUNTER — Encounter: Payer: Self-pay | Admitting: Adult Health

## 2022-09-01 NOTE — Telephone Encounter (Signed)
Last seen 12-29-2021 , annual visit 12-2022 scheduled.

## 2022-12-09 ENCOUNTER — Ambulatory Visit: Payer: PRIVATE HEALTH INSURANCE | Admitting: Adult Health

## 2022-12-09 ENCOUNTER — Encounter: Payer: Self-pay | Admitting: Adult Health

## 2022-12-24 DIAGNOSIS — Z0289 Encounter for other administrative examinations: Secondary | ICD-10-CM

## 2022-12-28 ENCOUNTER — Ambulatory Visit (INDEPENDENT_AMBULATORY_CARE_PROVIDER_SITE_OTHER): Payer: PRIVATE HEALTH INSURANCE | Admitting: Adult Health

## 2022-12-28 ENCOUNTER — Encounter: Payer: Self-pay | Admitting: Adult Health

## 2022-12-28 VITALS — BP 100/70 | HR 66 | Ht 63.0 in | Wt 215.0 lb

## 2022-12-28 DIAGNOSIS — G4733 Obstructive sleep apnea (adult) (pediatric): Secondary | ICD-10-CM

## 2022-12-28 DIAGNOSIS — R400 Somnolence: Secondary | ICD-10-CM | POA: Diagnosis not present

## 2022-12-28 DIAGNOSIS — G4700 Insomnia, unspecified: Secondary | ICD-10-CM | POA: Diagnosis not present

## 2022-12-28 NOTE — Progress Notes (Signed)
PATIENT: Kristi Davies DOB: 10-28-1976  REASON FOR VISIT: follow up HISTORY FROM: patient  HISTORY OF PRESENT ILLNESS: Today 12/28/22:   Kristi Davies is a 47 y.o. female with a history of OSA on CPAP. Returns today for follow-up.  Her CPAP download indicates that she has her machine 27 out of 30 days for compliance of 90%.  She on average uses her machine 4 hours and 13 minutes.  She uses a greater than 4 hours for compliance of 50%.  Residual AHI is 2.8 on 5 to 15 cm of water with EPR 3.  Continues on Sonata at bedtime PRN and takes Provigil 100 mg during the day if needed. Working with her Psychiatrist and therapist to help with sleep.Reports that she is bipolar and when she has a  manic episode then she doesn't sleep much.  12/09/21 [name] is a 47 year old female with a history of obstructive sleep apnea on CPAP, insomnia and daytime sleepiness.  She returns today for follow-up.  She reports that she is having nasal congestion.  She does report the nasal pillows.  She has seen ENT but reports that they did not offer any suggesti result to make sure which to make sure he was taking care of and driving.  Not controlled with this Has a history of homelessness and she we have not reached him discussing why he acted here but we would like to know because we do think that he probably has some medical things going on that need to be addressed but we just cannot tell provide  Compressing on the breast that is probably 8 ons.  She tried Flonase but did not find it beneficial.  She continues on Sonata daily at bedtime continues on Provigil 100 mg daily.  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  She now takes her medicine at 7:30 AM and it does not wear off until around 8:30 PM.  She returns today for an evaluation.     10/21/21[name] is a 47 year old female with a history if OSA on CPAP. She returns today for follow-up. Her CPAP download indicates that she used her machine 30/30  days for compliance of 100%. She used her machine >4 hours for compliance of 86.7%.  Residual AHI is 1.4 on 5 to 7.5 cm of water.  She reports that the CPAP is working well for her.  She feels that her fatigue score is elevated due to increased stress.  She continues to take Sonata at bedtime with good benefit.  She takes 100 mg of Provigil during the day.  States that the 200 mg made her stay awake for 16 hours.  She does not take it daily but typically when she has class.  HISTORY 03-23-2019, VIDEO-  Kristi Davies is a 47 y.o. female patient , she is currently in quarantine due to [place] 19 exposure and symptoms. She reports that the CPAP she has just been issued and moisture and pressure have helped symptoms of chest tightness. She has still a feeling of not being able to breathe deeply. The symptoms peaked about a week ago. Her husband was also sick in early March, his leg had a staph infection. He is an EMT.  She has felt good on CPAP, but has trouble to get 4 hours of use. 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  Sleep time is between 7.30 AM and 4 PM,  Currently not in class / virtual. Download  AHI 3.4/h, 27/30 days of use, average 4. 16 minutes, compliance 56.7% with better quality of sleep.  Fatigue may be related to Covid 19  .       REVIEW OF SYSTEMS: Out of a complete 14 system review of symptoms, the patient complains only of the following symptoms, and all other reviewed systems are negative.  ESS  9 FSS 50  ALLERGIES: Allergies  Allergen Reactions   Shrimp [Shellfish Allergy] Anaphylaxis   Latex Itching   Levaquin [Levofloxacin In D5w] Hives   Levofloxacin Hives   Sulfa Antibiotics Rash    HOME MEDICATIONS: Outpatient Medications Prior to Visit  Medication Sig Dispense Refill   albuterol (PROVENTIL) (2.5 MG/3ML) 0.083% nebulizer solution 1 vial in neb every 6 hours as needed  1   albuterol (VENTOLIN HFA) 108 (90 Base) MCG/ACT inhaler Inhale 1-2 puffs into the  lungs every 6 (six) hours as needed for wheezing or shortness of breath. 18 g 2   budesonide-formoterol (SYMBICORT) 80-4.5 MCG/ACT inhaler .Take 2 puffs first thing in am and then another 2 puffs about 12 hours later. 10.2 g 11   clonazePAM (KLONOPIN) 0.5 MG tablet TAKE 1 TABLET BY MOUTH 2 TIMES DAILY AS NEEDED FOR ANXIETY  0   levocetirizine (XYZAL) 5 MG tablet Take 5 mg by mouth every evening.     methocarbamol (ROBAXIN) 500 MG tablet Take by mouth.     metoprolol tartrate (LOPRESSOR) 25 MG tablet Take 25 mg by mouth daily.     modafinil (PROVIGIL) 200 MG tablet Take 1 tablet (200 mg total) by mouth daily as needed. 30 tablet 5   montelukast (SINGULAIR) 10 MG tablet Take 10 mg by mouth at bedtime.     NURTEC 75 MG TBDP Take by mouth every other day.     traZODone (DESYREL) 100 MG tablet Take 100 mg by mouth at bedtime.     UNABLE TO FIND Med Name: CPAP     zaleplon (SONATA) 10 MG capsule TAKE 1 CAPSULE BY MOUTH EVERY DAY AT BEDTIME AS NEEDED 30 capsule 1   benzonatate (TESSALON) 200 MG capsule Take 1 capsule (200 mg total) by mouth 3 (three) times daily as needed for cough. 45 capsule 2   cyclobenzaprine (FLEXERIL) 10 MG tablet Take 10 mg by mouth 3 (three) times daily as needed for muscle spasms.     lamoTRIgine (LAMICTAL) 150 MG tablet Take 150 mg by mouth daily.     No facility-administered medications prior to visit.    PAST MEDICAL HISTORY: Past Medical History:  Diagnosis Date   Allergy    Asthma    Bipolar disorder ([place])    Type 2   Complication of anesthesia    takes a while for Anesthesia to wear off   Depression    GERD (gastroesophageal reflux disease)    Headache    Migraines   IBS (irritable bowel syndrome)    Insomnia    Interstitial cystitis    goes to [facility] for treatment   PCOS (polycystic ovarian syndrome)    Pneumonia    history of   Vitamin D deficiency     PAST SURGICAL HISTORY: Past Surgical History:  Procedure Laterality Date   BLADDER  SUSPENSION N/A 08/30/2013   Procedure: TRANSVAGINAL TAPE (TVT) PROCEDURE;  Surgeon: Cheri Fowler, MD;  Location: [facility];  Service: Gynecology;  Laterality: N/A;   CYSTOSCOPY N/A 01/23/2020   Procedure: CYSTOSCOPY;  Surgeon: Cheri Fowler, MD;  Location[facility];  Service: Gynecology;  Laterality: N/A;   EYE SURGERY     HYSTEROSCOPY WITH NOVASURE N/A 08/30/2013   Procedure: HYSTEROSCOPY WITH NOVASURE;  Surgeon: Lavina Hamman, MD;  Location: [facility];  Service: Gynecology;  Laterality: N/A;  1 1/2 hrs OR time   LAPAROSCOPIC TUBAL LIGATION Bilateral 11/20/2015   Procedure: LAPAROSCOPIC TUBAL LIGATION;  Surgeon: Lavina Hamman, MD;  Location: [facility];  Service: Gynecology;  Laterality: Bilateral;   NASAL TURBINATE REDUCTION     SALPINGECTOMY     TRIGGER FINGER RELEASE  11/2019   by Dr. Janee Morn  trigger finger right ring finger right hand   VAGINAL HYSTERECTOMY N/A 01/23/2020   Procedure: HYSTERECTOMY VAGINAL;  Surgeon: Lavina Hamman, MD;  Location: [facility];  Service: Gynecology;  Laterality: N/A;   wisdom tooth extraction      FAMILY HISTORY: Family History  Problem Relation Age of Onset   Depression Mother    Diabetes Mother    Asthma Mother    Migraines Mother    Hypertension Mother    Diabetes Father    Heart disease Father    Hypertension Father    Sleep apnea Father    Dementia Maternal Grandmother    Breast cancer Maternal Grandmother    Heart disease Maternal Grandfather    Dementia Paternal Grandmother    Stroke Paternal Grandmother    Arthritis Paternal Grandfather    Heart disease Paternal Grandfather     SOCIAL HISTORY: Social History   Socioeconomic History   Marital status: Married    Spouse name: Not on file   Number of children: Not on file   Years of education: Not on file   Highest education level: Not on file  Occupational History   Not on file  Tobacco Use   Smoking status: Never   Smokeless tobacco: Never   Vaping Use   Vaping Use: Never used  Substance and Sexual Activity   Alcohol use: Yes    Alcohol/week: 2.0 standard drinks of alcohol    Types: 1 Glasses of wine, 1 Cans of beer per week   Drug use: No   Sexual activity: Yes    Birth control/protection: Other-see comments    Comment: vasectomy  Other Topics Concern   Not on file  Social History Narrative   Single   Self-employed   1 daughter   Exercise: yes   Caffeine use: yes   Social Determinants of Health   Financial Resource Strain: Not on file  Food Insecurity: Not on file  Transportation Needs: Not on file  Physical Activity: Not on file  Stress: Not on file  Social Connections: Not on file  Intimate Partner Violence: Not on file      PHYSICAL EXAM  Vitals:   12/28/22 1359  BP: 100/70  Pulse: 66  Weight: 215 lb (97.5 kg)  Height: 5\' 3"  (1.6 m)    Body mass index is 38.09 kg/m.  Generalized: Well developed, in no acute distress  Chest: Lungs clear to auscultation bilaterally  Neurological examination  Mentation: Alert oriented to time, place, history taking. Follows all commands speech and language fluent Cranial nerve II-XII:  Head turning and shoulder shrug  were normal and symmetric..  Gait and station: Gait is normal.    DIAGNOSTIC DATA (LABS, IMAGING, TESTING) - I reviewed patient records, labs, notes, testing and imaging myself where available.  Lab Results  Component Value Date   WBC 9.8 01/17/2020   HGB 13.4 01/17/2020   HCT 41.3 01/17/2020   MCV 91.4  01/17/2020   PLT 258 01/17/2020      Component Value Date/Time   NA 138 11/28/2016 1958   K 4.3 11/28/2016 1958   CL 101 11/28/2016 1958   CO2 25 11/28/2016 1958   GLUCOSE 108 (H) 11/28/2016 1958   BUN 19 11/28/2016 1958   CREATININE 0.86 11/28/2016 1958   CREATININE 0.85 11/12/2016 1333   CALCIUM 9.9 11/28/2016 1958   PROT 7.0 08/30/2013 0615   ALBUMIN 3.2 (L) 08/30/2013 0615   AST 15 08/30/2013 0615   ALT 11 08/30/2013 0615    ALKPHOS 58 08/30/2013 0615   BILITOT 0.3 08/30/2013 0615   GFRNONAA >60 11/28/2016 1958   GFRNONAA 87 11/12/2016 1333   GFRAA >60 11/28/2016 1958   GFRAA >89 11/12/2016 1333    Lab Results  Component Value Date   HGBA1C 5.7 05/13/2017   Lab Results  Component Value Date   VITAMINB12 364 11/12/2016   Lab Results  Component Value Date   TSH 1.02 11/12/2016      ASSESSMENT AND PLAN 47 y.o. year old female  has a past medical history of Allergy, Asthma, Bipolar disorder (HCC), Complication of anesthesia, Depression, GERD (gastroesophageal reflux disease), Headache, IBS (irritable bowel syndrome), Insomnia, Interstitial cystitis, PCOS (polycystic ovarian syndrome), Pneumonia, and Vitamin D deficiency. here with:  OSA on CPAP  - CPAP compliance suboptimal - Good treatment of AHI  - Encourage patient to use CPAP nightly and > 4 hours each night  2.  Insomnia  -Continue Sonata 10 mg at bedtime PRN  3.  Daytime sleepiness  -Continue Provigil 100 mg daily PRN  - F/U in 1 year or sooner if needed    Butch Penny, MSN, NP-C 12/28/2022, 2:16 PM [facility] [address], [address] [place], [place] [address] [phone]

## 2022-12-28 NOTE — Patient Instructions (Signed)
Continue using CPAP nightly and greater than 4 hours each night °If your symptoms worsen or you develop new symptoms please let us know.  ° °

## 2022-12-29 ENCOUNTER — Telehealth: Payer: Self-pay | Admitting: *Deleted

## 2022-12-29 NOTE — Telephone Encounter (Signed)
Kristi Davies (Key: [id number]) PA Case ID #: [id number] Rx #: [id number]  PA Modafinil is complete waiting  on approval

## 2022-12-29 NOTE — Telephone Encounter (Signed)
Approved today Request Reference Number: [id number]. MODAFINIL TAB 100MG  is approved through 06/29/2023. Your patient may now fill this prescription and it will be covered.  Modafinil approved

## 2023-02-25 ENCOUNTER — Encounter: Payer: Self-pay | Admitting: Adult Health

## 2023-02-25 MED ORDER — ZALEPLON 10 MG PO CAPS: ORAL_CAPSULE | ORAL | 1 refills | Status: DC

## 2023-02-25 NOTE — Telephone Encounter (Signed)
Last visit 12/28/22 Next visit 1/25     Rx refill sent to [facility] NP

## 2023-04-28 ENCOUNTER — Other Ambulatory Visit: Payer: Self-pay | Admitting: Adult Health

## 2023-04-28 DIAGNOSIS — R400 Somnolence: Secondary | ICD-10-CM

## 2023-04-28 NOTE — Telephone Encounter (Signed)
Requested Prescriptions   Pending Prescriptions Disp Refills   modafinil (PROVIGIL) 200 MG tablet [Pharmacy Med Name: MODAFINIL 200 MG TABLET] 30 tablet     Sig: TAKE 1 TABLET (200 MG TOTAL) BY MOUTH DAILY AS NEEDED.   Last seen 12/28/22, next appt scheduled for 12/27/23 Dispenses   Dispensed Days Supply Quantity Provider Pharmacy  MODAFINIL 200 MG TABLET 08/18/2022 30 30 each Butch Penny, NP [facility] [phone] [facility] - ...  MODAFINIL  100 MG TABS 07/17/2022 30 30 tablet Butch Penny, NP [facility]...  MODAFINIL  100 MG TABS 06/02/2022 30 30 tablet Butch Penny, NP [facility] [place] [facility].Marland KitchenMarland Kitchen

## 2023-04-28 NOTE — Telephone Encounter (Signed)
Can we go to 90 days - thi is a standing medication and is not going away. It may even safe her money.  Melvyn Novas, MD

## 2023-04-29 NOTE — Telephone Encounter (Signed)
Pt spouse said he has been told by pharmacy that a PA is needed for pt's  MODAFINIL 200 MG TABLET] 30 tablet

## 2023-05-11 ENCOUNTER — Telehealth: Payer: Self-pay

## 2023-05-11 ENCOUNTER — Telehealth: Payer: Self-pay | Admitting: *Deleted

## 2023-05-11 ENCOUNTER — Other Ambulatory Visit: Payer: Self-pay | Admitting: Adult Health

## 2023-05-11 ENCOUNTER — Other Ambulatory Visit (HOSPITAL_COMMUNITY): Payer: Self-pay

## 2023-05-11 DIAGNOSIS — R400 Somnolence: Secondary | ICD-10-CM

## 2023-05-11 DIAGNOSIS — G4733 Obstructive sleep apnea (adult) (pediatric): Secondary | ICD-10-CM

## 2023-05-11 NOTE — Telephone Encounter (Signed)
Sent a request to PA team for PA for Modafinil 200 mg.

## 2023-05-11 NOTE — Telephone Encounter (Signed)
A new telephone call encounter has been made for this PA Request-please see telephone note dated .Marland KitchenMarland Kitchen6/03/2023.

## 2023-05-11 NOTE — Telephone Encounter (Signed)
Pharmacy Patient Advocate Encounter   Received notification from [facility] that prior authorization for Modafinil 200MG  tablets is required/requested.   PA submitted to [facility] via CoverMyMeds Key or (Medicaid) confirmation # [id number] Status is pending

## 2023-05-11 NOTE — Telephone Encounter (Signed)
Spouse reports he juts spoke with pharmacy and was told the needed PA for the MODAFINIL was denied.  He was not given the date, but he is asking RN to look into this and give them a call back

## 2023-05-11 NOTE — Telephone Encounter (Signed)
We have a PA for Modafinil 100 mg but not 200 mg. Patient is on 200 mg now. Can you do PA asap?

## 2023-05-12 ENCOUNTER — Other Ambulatory Visit (HOSPITAL_COMMUNITY): Payer: Self-pay

## 2023-05-12 MED ORDER — ZALEPLON 10 MG PO CAPS: ORAL_CAPSULE | ORAL | 1 refills | Status: DC

## 2023-05-12 NOTE — Telephone Encounter (Signed)
Requested Prescriptions   Pending Prescriptions Disp Refills   zaleplon (SONATA) 10 MG capsule 30 capsule 1   Signed Prescriptions Disp Refills   zaleplon (SONATA) 10 MG capsule 30 capsule 1    Sig: TAKE 1 CAPSULE BY MOUTH EVERY DAY AT BEDTIME AS NEEDED    Authorizing Provider: Butch Penny    Ordering User: [place]   I didn't realize this was controlled substance so it printed and I reordered through the same encounter which is why you will see a signed prescription  Last seen 12/28/22, next appt scheduled for 12/27/23 In megan's last note she said continue sonata  Dispenses   Dispensed Days Supply Quantity Provider Pharmacy  ZALEPLON 10 MG CAPSULE 04/04/2023 30 30 each Butch Penny, NP [facility] [phone] [facility] ...  ZALEPLON 10 MG CAPSULE 02/25/2023 30 30 each Butch Penny, NP [facility] [phone] [facility] ...  ZALEPLON  10 MG CAPS 11/16/2022 30 30 capsule Butch Penny, NP [facility]...  ZALEPLON  10 MG CAPS 09/02/2022 30 30 capsule Butch Penny, NP [facility]...  ZALEPLON  10 MG CAPS 06/02/2022 30 30 capsule Butch Penny, NP [facility] [place] [facility].Marland KitchenMarland Kitchen

## 2023-05-14 ENCOUNTER — Other Ambulatory Visit (HOSPITAL_COMMUNITY): Payer: Self-pay

## 2023-05-14 NOTE — Telephone Encounter (Signed)
Pharmacy Patient Advocate Encounter  Received notification from [facility] that the request for prior authorization for Modafinil 200MG  tablets has been denied due to see below.    Please be advised we currently do not have a Pharmacist to review denials, therefore you will need to process appeals accordingly as needed. Thanks for your support at this time.   You may fax [phone], to appeal.  There is also an option to Eappeal on CMM.  The denial letter has been placed in the chart under the media tab. Reference number: [id number]

## 2023-05-17 ENCOUNTER — Encounter: Payer: Self-pay | Admitting: *Deleted

## 2023-05-17 ENCOUNTER — Encounter: Payer: Self-pay | Admitting: Adult Health

## 2023-05-17 NOTE — Telephone Encounter (Signed)
Ok to switch to armodafinil 150 mg daily PRN

## 2023-05-17 NOTE — Telephone Encounter (Signed)
Mychart message sent to patient.

## 2023-05-18 MED ORDER — ARMODAFINIL 150 MG PO TABS
150.0000 mg | ORAL_TABLET | Freq: Every day | ORAL | 5 refills | Status: DC | PRN
Start: 2023-05-18 — End: 2023-12-07

## 2023-05-18 NOTE — Addendum Note (Signed)
Addended by: Bertram Savin on: 05/18/2023 07:35 AM   Modules accepted: Orders

## 2023-05-18 NOTE — Telephone Encounter (Signed)
I have been in touch with the patient already about switching to another medication because Modafinil 200 mg was denied.

## 2023-05-18 NOTE — Addendum Note (Signed)
Addended by: Enedina Finner on: 05/18/2023 07:49 AM   Modules accepted: Orders

## 2023-05-18 NOTE — Telephone Encounter (Signed)
   I checked the [place] registry:    Rx for Armodafinil 150 mg daily PRN sent to [facility] NP for e-signature. I also canceled Modafinil.

## 2023-06-16 ENCOUNTER — Telehealth: Payer: Self-pay | Admitting: Adult Health

## 2023-06-16 NOTE — Telephone Encounter (Signed)
LVM and sent mychart msg informing pt of need to reschedule 12/27/23 appt - NP out

## 2023-06-26 ENCOUNTER — Other Ambulatory Visit (HOSPITAL_COMMUNITY): Payer: Self-pay

## 2023-07-28 ENCOUNTER — Other Ambulatory Visit: Payer: Self-pay | Admitting: Adult Health

## 2023-08-02 ENCOUNTER — Other Ambulatory Visit: Payer: Self-pay

## 2023-08-02 NOTE — Telephone Encounter (Signed)
Unable to review drug fill history. Last office visit: 12/28/22 Next office visit: 01/03/24

## 2023-10-10 ENCOUNTER — Other Ambulatory Visit: Payer: Self-pay | Admitting: Adult Health

## 2023-10-11 NOTE — Telephone Encounter (Signed)
Last fill 08-16-2023 #30.  Last seen 12-2022, next appt 12-2023

## 2023-12-05 ENCOUNTER — Other Ambulatory Visit: Payer: Self-pay | Admitting: Adult Health

## 2023-12-05 DIAGNOSIS — G4733 Obstructive sleep apnea (adult) (pediatric): Secondary | ICD-10-CM

## 2023-12-05 DIAGNOSIS — R400 Somnolence: Secondary | ICD-10-CM

## 2023-12-23 ENCOUNTER — Other Ambulatory Visit: Payer: Self-pay | Admitting: Adult Health

## 2023-12-27 ENCOUNTER — Ambulatory Visit: Payer: PRIVATE HEALTH INSURANCE | Admitting: Adult Health

## 2024-01-03 ENCOUNTER — Ambulatory Visit: Payer: BC Managed Care – PPO | Admitting: Adult Health

## 2024-01-03 ENCOUNTER — Encounter: Payer: Self-pay | Admitting: Adult Health

## 2024-01-03 VITALS — BP 137/87 | HR 84 | Ht 63.0 in | Wt 209.0 lb

## 2024-01-03 DIAGNOSIS — G4733 Obstructive sleep apnea (adult) (pediatric): Secondary | ICD-10-CM

## 2024-01-03 DIAGNOSIS — R400 Somnolence: Secondary | ICD-10-CM

## 2024-01-03 DIAGNOSIS — G4700 Insomnia, unspecified: Secondary | ICD-10-CM | POA: Diagnosis not present

## 2024-01-03 MED ORDER — MODAFINIL 100 MG PO TABS: 100.0000 mg | ORAL_TABLET | Freq: Every day | ORAL | 1 refills | Status: AC

## 2024-01-03 NOTE — Progress Notes (Signed)
Cpap supply order sent to [facility].   New, Maryella Shivers, Otilio Jefferson, RN; Alain Honey; Jeris Penta, [place]; 1 other Received, thank you!

## 2024-01-03 NOTE — Progress Notes (Signed)
PATIENT: Kristi Davies DOB: 10/18/1976  REASON FOR VISIT: follow up HISTORY FROM: patient  Chief Complaint  Patient presents with   Follow-up    Patient in room #19 and alone. Patient states Patient believe her Rx Armodafinil doesn't and it makes her more sleepy during the day.     HISTORY OF PRESENT ILLNESS: Today 01/03/24:   Kristi Davies is a 48 y.o. female with a history of OSA on CPAP, daytime sleepiness and insomnia. Returns today for follow-up.  She reports that the CPAP has been working well.  She does state that she needs new supplies.  She did try armodafinil.  She does not think it is as beneficial as modafinil.  She states that she is more sleepy throughout the day.  Would like to go back to modafinil.  She continues on Sonata at bedtime.  Her download is below     12/28/22: Kristi Davies is a 48 y.o. female with a history of OSA on CPAP. Returns today for follow-up.  Her CPAP download indicates that she has her machine 27 out of 30 days for compliance of 90%.  She on average uses her machine 4 hours and 13 minutes.  She uses a greater than 4 hours for compliance of 50%.  Residual AHI is 2.8 on 5 to 15 cm of water with EPR 3.  Continues on Sonata at bedtime PRN and takes Provigil 100 mg during the day if needed. Working with her Psychiatrist and therapist to help with sleep.Reports that she is bipolar and when she has a  manic episode then she doesn't sleep much.  12/09/21 Kristi Davies is a 48 year old female with a history of obstructive sleep apnea on CPAP, insomnia and daytime sleepiness.  She returns today for follow-up.  She reports that she is having nasal congestion.  She does report the nasal pillows.  She has seen ENT but reports that they did not offer any suggesti result to make sure which to make sure he was taking care of and driving.  Not controlled with this Has a history of homelessness and she we have not reached him discussing why he acted here but we would  like to know because we do think that he probably has some medical things going on that need to be addressed but we just cannot tell provide  Compressing on the breast that is probably 8 ons.  She tried Flonase but did not find it beneficial.  She continues on Sonata daily at bedtime continues on Provigil 100 mg daily.  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  She now takes her medicine at 7:30 AM and it does not wear off until around 8:30 PM.  She returns today for an evaluation.     10/21/21Ms. Davies is a 48 year old female with a history if OSA on CPAP. She returns today for follow-up. Her CPAP download indicates that she used her machine 30/30 days for compliance of 100%. She used her machine >4 hours for compliance of 86.7%.  Residual AHI is 1.4 on 5 to 7.5 cm of water.  She reports that the CPAP is working well for her.  She feels that her fatigue score is elevated due to increased stress.  She continues to take Sonata at bedtime with good benefit.  She takes 100 mg of Provigil during the day.  States that the 200 mg made her stay awake for 16 hours.  She does not take it daily but  typically when she has class.  HISTORY 03-23-2019, VIDEO-  Kristi Davies is a 48 y.o. female patient , she is currently in quarantine due to CORVID 19 exposure and symptoms. She reports that the CPAP she has just been issued and moisture and pressure have helped symptoms of chest tightness. She has still a feeling of not being able to breathe deeply. The symptoms peaked about a week ago. Her husband was also sick in early March, his leg had a staph infection. He is an EMT.  She has felt good on CPAP, but has trouble to get 4 hours of use. 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  Sleep time is between 7.30 AM and 4 PM,  Currently not in class / virtual. Download AHI 3.4/h, 27/30 days of use, average 4. 16 minutes, compliance 56.7% with better quality of sleep.  Fatigue may be related to Covid 19   .       REVIEW OF SYSTEMS: Out of a complete 14 system review of symptoms, the patient complains only of the following symptoms, and all other reviewed systems are negative.  ESS  9 FSS 50  ALLERGIES: Allergies  Allergen Reactions   Shellfish Allergy Anaphylaxis    Other Reaction(s): angioedema   Shrimp Extract Anaphylaxis   Sulfa Antibiotics Rash and Anaphylaxis   Latex Itching and Rash   Levaquin [Levofloxacin In D5w] Hives   Levofloxacin Hives    HOME MEDICATIONS: Outpatient Medications Prior to Visit  Medication Sig Dispense Refill   albuterol (PROVENTIL) (2.5 MG/3ML) 0.083% nebulizer solution 1 vial in neb every 6 hours as needed  1   albuterol (VENTOLIN HFA) 108 (90 Base) MCG/ACT inhaler Inhale 1-2 puffs into the lungs every 6 (six) hours as needed for wheezing or shortness of breath. 18 g 2   Armodafinil 150 MG tablet TAKE 1 TABLET (150 MG TOTAL) BY MOUTH DAILY AS NEEDED. 30 tablet 5   clonazePAM (KLONOPIN) 0.5 MG tablet TAKE 1 TABLET BY MOUTH 2 TIMES DAILY AS NEEDED FOR ANXIETY  0   levocetirizine (XYZAL) 5 MG tablet Take 5 mg by mouth every evening.     methocarbamol (ROBAXIN) 750 MG tablet Take 750 mg by mouth every 6 (six) hours as needed for muscle spasms.     metoprolol tartrate (LOPRESSOR) 25 MG tablet Take 25 mg by mouth daily.     montelukast (SINGULAIR) 10 MG tablet Take 10 mg by mouth at bedtime.     pregabalin (LYRICA) 25 MG capsule Take 25 mg by mouth 2 (two) times daily.     traZODone (DESYREL) 100 MG tablet Take 100 mg by mouth at bedtime.     UNABLE TO FIND Med Name: CPAP     zaleplon (SONATA) 10 MG capsule TAKE 1 CAPSULE BY MOUTH EVERY DAY AT BEDTIME AS NEEDED 30 capsule 1   benzonatate (TESSALON) 200 MG capsule Take 1 capsule (200 mg total) by mouth 3 (three) times daily as needed for cough. (Patient not taking: Reported on 01/03/2024) 45 capsule 2   budesonide-formoterol (SYMBICORT) 80-4.5 MCG/ACT inhaler .Take 2 puffs first thing in am and then another 2  puffs about 12 hours later. (Patient not taking: Reported on 01/03/2024) 10.2 g 11   cyclobenzaprine (FLEXERIL) 10 MG tablet Take 10 mg by mouth 3 (three) times daily as needed for muscle spasms. (Patient not taking: Reported on 01/03/2024)     NURTEC 75 MG TBDP Take by mouth every other day. (Patient not taking: Reported on 01/03/2024)  lamoTRIgine (LAMICTAL) 150 MG tablet Take 150 mg by mouth daily.     No facility-administered medications prior to visit.    PAST MEDICAL HISTORY: Past Medical History:  Diagnosis Date   Allergy    Asthma    Bipolar disorder (HCC)    Type 2   Complication of anesthesia    takes a while for Anesthesia to wear off   Depression    GERD (gastroesophageal reflux disease)    Headache    Migraines   IBS (irritable bowel syndrome)    Insomnia    Interstitial cystitis    goes to [facility] for treatment   PCOS (polycystic ovarian syndrome)    Pneumonia    history of   Vitamin D deficiency     PAST SURGICAL HISTORY: Past Surgical History:  Procedure Laterality Date   BLADDER SUSPENSION N/A 08/30/2013   Procedure: TRANSVAGINAL TAPE (TVT) PROCEDURE;  Surgeon: Lavina Hamman, MD;  Location: [facility];  Service: Gynecology;  Laterality: N/A;   CYSTOSCOPY N/A 01/23/2020   Procedure: CYSTOSCOPY;  Surgeon: Lavina Hamman, MD;  [facility];  Service: Gynecology;  Laterality: N/A;   EYE SURGERY     HYSTEROSCOPY WITH NOVASURE N/A 08/30/2013   Procedure: HYSTEROSCOPY WITH NOVASURE;  Surgeon: Lavina Hamman, MD;  Location: [facility];  Service: Gynecology;  Laterality: N/A;  1 1/2 hrs OR time   LAPAROSCOPIC TUBAL LIGATION Bilateral 11/20/2015   Procedure: LAPAROSCOPIC TUBAL LIGATION;  Surgeon: Lavina Hamman, MD;  Location: [facility];  Service: Gynecology;  Laterality: Bilateral;   NASAL TURBINATE REDUCTION     SALPINGECTOMY     TRIGGER FINGER RELEASE  11/2019   by Dr. Janee Morn  trigger finger right ring finger right hand   VAGINAL  HYSTERECTOMY N/A 01/23/2020   Procedure: HYSTERECTOMY VAGINAL;  Surgeon: Lavina Hamman, MD;  Location: [facility] [place];  Service: Gynecology;  Laterality: N/A;   wisdom tooth extraction      FAMILY HISTORY: Family History  Problem Relation Age of Onset   Depression Mother    Diabetes Mother    Asthma Mother    Migraines Mother    Hypertension Mother    Diabetes Father    Heart disease Father    Hypertension Father    Sleep apnea Father    Dementia Maternal Grandmother    Breast cancer Maternal Grandmother    Heart disease Maternal Grandfather    Dementia Paternal Grandmother    Stroke Paternal Grandmother    Arthritis Paternal Grandfather    Heart disease Paternal Grandfather     SOCIAL HISTORY: Social History   Socioeconomic History   Marital status: Married    Spouse name: Not on file   Number of children: Not on file   Years of education: Not on file   Highest education level: Not on file  Occupational History   Not on file  Tobacco Use   Smoking status: Never   Smokeless tobacco: Never  Vaping Use   Vaping status: Never Used  Substance and Sexual Activity   Alcohol use: Yes    Alcohol/week: 2.0 standard drinks of alcohol    Types: 1 Glasses of wine, 1 Cans of beer per week   Drug use: No   Sexual activity: Yes    Birth control/protection: Other-see comments    Comment: vasectomy  Other Topics Concern   Not on file  Social History Narrative   Single   Self-employed   1 daughter   Exercise: yes  Caffeine use: yes   Social Drivers of Corporate investment banker Strain: Low Risk  (06/15/2023)   Received from [facility], [facility]   Overall Financial Resource Strain (CARDIA)    Difficulty of Paying Living Expenses: Not very hard  Food Insecurity: Low Risk  (11/01/2023)   Received from [facility]   Hunger Vital Sign    Worried About Running Out of Food in the Last Year: Never true    Ran Out of Food in the Last Year: Never true   Transportation Needs: No Transportation Needs (11/01/2023)   Received from [facility]    In the past 12 months, has lack of reliable transportation kept you from medical appointments, meetings, work or from getting things needed for daily living? : No  Physical Activity: Unknown (06/15/2023)   Received from [facility]   Exercise Vital Sign    Days of Exercise per Week: 0 days    Minutes of Exercise per Session: Not on file  Stress: No Stress Concern Present (06/15/2023)   Received from [facility], [facility] - Occupational Stress Questionnaire    Feeling of Stress : Only a little  Social Connections: Somewhat Isolated (06/15/2023)   Received from [facility], [facility]   Social Network    How would you rate your social network (family, work, friends)?: Restricted participation with some degree of social isolation  Intimate Partner Violence: Not At Risk (06/15/2023)   Received from [facility], [facility]   HITS    Over the last 12 months how often did your partner physically hurt you?: Never    Over the last 12 months how often did your partner insult you or talk down to you?: Never    Over the last 12 months how often did your partner threaten you with physical harm?: Never    Over the last 12 months how often did your partner scream or curse at you?: Never      PHYSICAL EXAM  Vitals:   01/03/24 1342  BP: 137/87  Pulse: 84  Weight: 209 lb (94.8 kg)  Height: 5\' 3"  (1.6 m)    Body mass index is 37.02 kg/m.  Generalized: Well developed, in no acute distress  Chest: Lungs clear to auscultation bilaterally  Neurological examination  Mentation: Alert oriented to time, place, history taking. Follows all commands speech and language fluent Cranial nerve II-XII:  Head turning and shoulder shrug  were normal and symmetric.Marland Kitchen  Strength: 5/5 strength in all extremities.  Was unable to test  proximal strength in the right lower extremities due to patient reported hamstring pull. Gait and station: Gait is normal.    DIAGNOSTIC DATA (LABS, IMAGING, TESTING) - I reviewed patient records, labs, notes, testing and imaging myself where available.  Lab Results  Component Value Date   WBC 9.8 01/17/2020   HGB 13.4 01/17/2020   HCT 41.3 01/17/2020   MCV 91.4 01/17/2020   PLT 258 01/17/2020      Component Value Date/Time   NA 138 11/28/2016 1958   K 4.3 11/28/2016 1958   CL 101 11/28/2016 1958   CO2 25 11/28/2016 1958   GLUCOSE 108 (H) 11/28/2016 1958   BUN 19 11/28/2016 1958   CREATININE 0.86 11/28/2016 1958   CREATININE 0.85 11/12/2016 1333   CALCIUM 9.9 11/28/2016 1958   PROT 7.0 08/30/2013 0615   ALBUMIN 3.2 (L) 08/30/2013 0615   AST 15 08/30/2013  0615   ALT 11 08/30/2013 0615   ALKPHOS 58 08/30/2013 0615   BILITOT 0.3 08/30/2013 0615   GFRNONAA >60 11/28/2016 1958   GFRNONAA 87 11/12/2016 1333   GFRAA >60 11/28/2016 1958   GFRAA >89 11/12/2016 1333    Lab Results  Component Value Date   HGBA1C 5.7 05/13/2017   Lab Results  Component Value Date   VITAMINB12 364 11/12/2016   Lab Results  Component Value Date   TSH 1.02 11/12/2016      ASSESSMENT AND PLAN 48 y.o. year old female  has a past medical history of Allergy, Asthma, Bipolar disorder (HCC), Complication of anesthesia, Depression, GERD (gastroesophageal reflux disease), Headache, IBS (irritable bowel syndrome), Insomnia, Interstitial cystitis, PCOS (polycystic ovarian syndrome), Pneumonia, and Vitamin D deficiency. here with:  OSA on CPAP  - CPAP compliance suboptimal - Good treatment of AHI  - Encourage patient to use CPAP nightly and > 4 hours each night  2.  Insomnia  -Continue Sonata 10 mg at bedtime PRN  3.  Daytime sleepiness  -Restart Provigil 100 mg daily PRN - Stop Armodafinil   - F/U in 8 months or sooner if needed    Butch Penny, MSN, NP-C 01/03/2024, 2:13  PM [facility] [address] [place], [place] [address] [phone]

## 2024-01-03 NOTE — Patient Instructions (Signed)
  OSA on CPAP  - CPAP compliance suboptimal - Good treatment of AHI  -  use CPAP nightly and > 4 hours each night  2.  Insomnia  -Continue Sonata 10 mg at bedtime PRN  3.  Daytime sleepiness  -Restart Provigil 100 mg daily

## 2024-01-19 ENCOUNTER — Telehealth: Payer: Self-pay | Admitting: Adult Health

## 2024-01-19 ENCOUNTER — Encounter: Payer: Self-pay | Admitting: Adult Health

## 2024-01-19 NOTE — Telephone Encounter (Signed)
LVM, sent MyChart msg, sent cx letter informing pt of appt r/s- megan in meeting.

## 2024-03-07 ENCOUNTER — Other Ambulatory Visit: Payer: Self-pay | Admitting: Adult Health

## 2024-05-02 ENCOUNTER — Telehealth: Payer: Self-pay | Admitting: Adult Health

## 2024-05-02 ENCOUNTER — Encounter: Payer: Self-pay | Admitting: Adult Health

## 2024-05-02 NOTE — Telephone Encounter (Signed)
 LVM and sent letter in mail informing pt of need to reschedule 09/20/24 appt - NP schedule change

## 2024-09-06 ENCOUNTER — Ambulatory Visit: Payer: BC Managed Care – PPO | Admitting: Adult Health

## 2024-09-20 ENCOUNTER — Ambulatory Visit: Payer: BC Managed Care – PPO | Admitting: Adult Health
